# Patient Record
Sex: Male | Born: 1935 | Race: White | Hispanic: No | Marital: Married
Health system: Southern US, Community
[De-identification: ages and names within clinical notes are randomized; demographics above are authoritative.]

---

## 2005-02-15 ENCOUNTER — Ambulatory Visit: Payer: Self-pay | Admitting: Otolaryngology

## 2005-02-18 ENCOUNTER — Inpatient Hospital Stay: Payer: Self-pay | Admitting: Otolaryngology

## 2005-02-26 ENCOUNTER — Ambulatory Visit: Payer: Self-pay | Admitting: Otolaryngology

## 2005-03-15 ENCOUNTER — Ambulatory Visit: Payer: Self-pay | Admitting: Radiation Oncology

## 2005-04-05 ENCOUNTER — Ambulatory Visit: Payer: Self-pay | Admitting: Radiation Oncology

## 2005-05-06 ENCOUNTER — Ambulatory Visit: Payer: Self-pay | Admitting: Radiation Oncology

## 2005-06-03 ENCOUNTER — Ambulatory Visit: Payer: Self-pay | Admitting: Radiation Oncology

## 2005-11-04 ENCOUNTER — Ambulatory Visit: Payer: Self-pay | Admitting: Radiation Oncology

## 2006-05-09 ENCOUNTER — Ambulatory Visit: Payer: Self-pay | Admitting: Radiation Oncology

## 2007-05-07 ENCOUNTER — Ambulatory Visit: Payer: Self-pay | Admitting: Radiation Oncology

## 2007-05-10 ENCOUNTER — Ambulatory Visit: Payer: Self-pay | Admitting: Radiation Oncology

## 2007-05-29 ENCOUNTER — Ambulatory Visit: Payer: Self-pay | Admitting: Internal Medicine

## 2007-06-04 ENCOUNTER — Ambulatory Visit: Payer: Self-pay | Admitting: Radiation Oncology

## 2008-05-06 ENCOUNTER — Ambulatory Visit: Payer: Self-pay | Admitting: Radiation Oncology

## 2008-05-09 ENCOUNTER — Ambulatory Visit: Payer: Self-pay | Admitting: Radiation Oncology

## 2008-06-03 ENCOUNTER — Ambulatory Visit: Payer: Self-pay | Admitting: Radiation Oncology

## 2008-06-03 ENCOUNTER — Inpatient Hospital Stay: Payer: Self-pay | Admitting: Internal Medicine

## 2009-05-06 ENCOUNTER — Ambulatory Visit: Payer: Self-pay | Admitting: Radiation Oncology

## 2009-05-09 ENCOUNTER — Ambulatory Visit: Payer: Self-pay | Admitting: Radiation Oncology

## 2009-06-03 ENCOUNTER — Ambulatory Visit: Payer: Self-pay | Admitting: Radiation Oncology

## 2010-05-08 ENCOUNTER — Ambulatory Visit: Payer: Self-pay | Admitting: Radiation Oncology

## 2010-06-04 ENCOUNTER — Ambulatory Visit: Payer: Self-pay | Admitting: Radiation Oncology

## 2011-05-12 ENCOUNTER — Ambulatory Visit: Payer: Self-pay | Admitting: Physician Assistant

## 2014-03-05 ENCOUNTER — Ambulatory Visit: Payer: Self-pay | Admitting: Internal Medicine

## 2014-03-05 LAB — CBC
HCT: 37.1 % — ABNORMAL LOW (ref 40.0–52.0)
HGB: 12 g/dL — ABNORMAL LOW (ref 13.0–18.0)
MCH: 31.7 pg (ref 26.0–34.0)
MCHC: 32.3 g/dL (ref 32.0–36.0)
MCV: 98 fL (ref 80–100)
PLATELETS: 190 10*3/uL (ref 150–440)
RBC: 3.78 10*6/uL — ABNORMAL LOW (ref 4.40–5.90)
RDW: 13.4 % (ref 11.5–14.5)
WBC: 9.8 10*3/uL (ref 3.8–10.6)

## 2014-03-05 LAB — URINALYSIS, COMPLETE
BILIRUBIN, UR: NEGATIVE
Bacteria: NONE SEEN
Blood: NEGATIVE
Glucose,UR: NEGATIVE mg/dL (ref 0–75)
Ketone: NEGATIVE
LEUKOCYTE ESTERASE: NEGATIVE
Nitrite: NEGATIVE
Ph: 7 (ref 4.5–8.0)
Protein: NEGATIVE
RBC,UR: 2 /HPF (ref 0–5)
Specific Gravity: 1.01 (ref 1.003–1.030)
Squamous Epithelial: NONE SEEN

## 2014-03-05 LAB — COMPREHENSIVE METABOLIC PANEL
ALBUMIN: 3.3 g/dL — AB (ref 3.4–5.0)
Alkaline Phosphatase: 78 U/L
Anion Gap: 7 (ref 7–16)
BUN: 24 mg/dL — AB (ref 7–18)
Bilirubin,Total: 0.4 mg/dL (ref 0.2–1.0)
Calcium, Total: 8.8 mg/dL (ref 8.5–10.1)
Chloride: 104 mmol/L (ref 98–107)
Co2: 29 mmol/L (ref 21–32)
Creatinine: 1.19 mg/dL (ref 0.60–1.30)
EGFR (African American): >60
EGFR (Non-African Amer.): >60
GLUCOSE: 113 mg/dL — AB (ref 65–99)
Osmolality: 284 (ref 275–301)
POTASSIUM: 4 mmol/L (ref 3.5–5.1)
SGOT(AST): 28 U/L (ref 15–37)
SGPT (ALT): 21 U/L
SODIUM: 140 mmol/L (ref 136–145)
Total Protein: 6.6 g/dL (ref 6.4–8.2)

## 2014-03-05 LAB — PROTIME-INR
INR: 1
Prothrombin Time: 12.7 secs (ref 11.5–14.7)

## 2014-03-05 LAB — TROPONIN I: TROPONIN-I: 0.04 ng/mL

## 2014-03-06 LAB — HEMOGLOBIN A1C: Hemoglobin A1C: 6.2 % (ref 4.2–6.3)

## 2014-03-07 ENCOUNTER — Inpatient Hospital Stay: Payer: Self-pay | Admitting: Internal Medicine

## 2014-03-08 LAB — CBC WITH DIFFERENTIAL/PLATELET
Basophil #: 0.1 10*3/uL (ref 0.0–0.1)
Basophil %: 0.7 %
EOS ABS: 0.4 10*3/uL (ref 0.0–0.7)
Eosinophil %: 5.2 %
HCT: 32 % — ABNORMAL LOW (ref 40.0–52.0)
HGB: 10.3 g/dL — AB (ref 13.0–18.0)
Lymphocyte #: 1.6 10*3/uL (ref 1.0–3.6)
Lymphocyte %: 21.8 %
MCH: 31.9 pg (ref 26.0–34.0)
MCHC: 32.1 g/dL (ref 32.0–36.0)
MCV: 100 fL (ref 80–100)
Monocyte #: 1 x10 3/mm (ref 0.2–1.0)
Monocyte %: 12.9 %
NEUTROS ABS: 4.5 10*3/uL (ref 1.4–6.5)
Neutrophil %: 59.4 %
PLATELETS: 161 10*3/uL (ref 150–440)
RBC: 3.22 10*6/uL — ABNORMAL LOW (ref 4.40–5.90)
RDW: 13.2 % (ref 11.5–14.5)
WBC: 7.6 10*3/uL (ref 3.8–10.6)

## 2014-03-08 LAB — BASIC METABOLIC PANEL
ANION GAP: 8 (ref 7–16)
BUN: 24 mg/dL — ABNORMAL HIGH (ref 7–18)
CALCIUM: 8 mg/dL — AB (ref 8.5–10.1)
CHLORIDE: 108 mmol/L — AB (ref 98–107)
Co2: 27 mmol/L (ref 21–32)
Creatinine: 1.16 mg/dL (ref 0.60–1.30)
EGFR (African American): >60
GLUCOSE: 93 mg/dL (ref 65–99)
Osmolality: 289 (ref 275–301)
Potassium: 4 mmol/L (ref 3.5–5.1)
SODIUM: 143 mmol/L (ref 136–145)

## 2014-03-09 LAB — HEMOGLOBIN: HGB: 10.2 g/dL — AB (ref 13.0–18.0)

## 2014-03-11 LAB — CBC WITH DIFFERENTIAL/PLATELET
Basophil #: 0 10*3/uL (ref 0.0–0.1)
Basophil %: 0.4 %
EOS PCT: 1.4 %
Eosinophil #: 0.1 10*3/uL (ref 0.0–0.7)
HCT: 33.6 % — AB (ref 40.0–52.0)
HGB: 11.1 g/dL — ABNORMAL LOW (ref 13.0–18.0)
LYMPHS PCT: 13.8 %
Lymphocyte #: 1.3 10*3/uL (ref 1.0–3.6)
MCH: 32.1 pg (ref 26.0–34.0)
MCHC: 33 g/dL (ref 32.0–36.0)
MCV: 97 fL (ref 80–100)
Monocyte #: 1.4 x10 3/mm — ABNORMAL HIGH (ref 0.2–1.0)
Monocyte %: 14.9 %
Neutrophil #: 6.7 10*3/uL — ABNORMAL HIGH (ref 1.4–6.5)
Neutrophil %: 69.5 %
PLATELETS: 185 10*3/uL (ref 150–440)
RBC: 3.46 10*6/uL — AB (ref 4.40–5.90)
RDW: 13.1 % (ref 11.5–14.5)
WBC: 9.6 10*3/uL (ref 3.8–10.6)

## 2014-03-12 LAB — CBC WITH DIFFERENTIAL/PLATELET
BASOS ABS: 0 10*3/uL (ref 0.0–0.1)
BASOS PCT: 0.4 %
EOS ABS: 0.2 10*3/uL (ref 0.0–0.7)
Eosinophil %: 2 %
HCT: 34 % — AB (ref 40.0–52.0)
HGB: 11.2 g/dL — AB (ref 13.0–18.0)
Lymphocyte #: 1.4 10*3/uL (ref 1.0–3.6)
Lymphocyte %: 14.3 %
MCH: 32.1 pg (ref 26.0–34.0)
MCHC: 33 g/dL (ref 32.0–36.0)
MCV: 97 fL (ref 80–100)
MONO ABS: 1.6 x10 3/mm — AB (ref 0.2–1.0)
MONOS PCT: 16.4 %
NEUTROS PCT: 66.9 %
Neutrophil #: 6.7 10*3/uL — ABNORMAL HIGH (ref 1.4–6.5)
PLATELETS: 186 10*3/uL (ref 150–440)
RBC: 3.5 10*6/uL — ABNORMAL LOW (ref 4.40–5.90)
RDW: 13.5 % (ref 11.5–14.5)
WBC: 10 10*3/uL (ref 3.8–10.6)

## 2014-03-12 LAB — CREATININE, SERUM
Creatinine: 1.14 mg/dL (ref 0.60–1.30)
EGFR (African American): >60
EGFR (Non-African Amer.): >60

## 2014-03-26 ENCOUNTER — Inpatient Hospital Stay: Payer: Self-pay | Admitting: Internal Medicine

## 2014-03-26 LAB — CBC WITH DIFFERENTIAL/PLATELET
Basophil #: 0.1 10*3/uL (ref 0.0–0.1)
Basophil %: 0.4 %
Eosinophil #: 0.2 10*3/uL (ref 0.0–0.7)
Eosinophil %: 1.5 %
HCT: 37.9 % — AB (ref 40.0–52.0)
HGB: 12 g/dL — ABNORMAL LOW (ref 13.0–18.0)
LYMPHS PCT: 7.1 %
Lymphocyte #: 1 10*3/uL (ref 1.0–3.6)
MCH: 30.4 pg (ref 26.0–34.0)
MCHC: 31.6 g/dL — ABNORMAL LOW (ref 32.0–36.0)
MCV: 96 fL (ref 80–100)
MONO ABS: 2 x10 3/mm — AB (ref 0.2–1.0)
Monocyte %: 14.5 %
NEUTROS PCT: 76.5 %
Neutrophil #: 10.8 10*3/uL — ABNORMAL HIGH (ref 1.4–6.5)
Platelet: 340 10*3/uL (ref 150–440)
RBC: 3.94 10*6/uL — ABNORMAL LOW (ref 4.40–5.90)
RDW: 13.8 % (ref 11.5–14.5)
WBC: 14.1 10*3/uL — AB (ref 3.8–10.6)

## 2014-03-26 LAB — URINALYSIS, COMPLETE
BACTERIA: NONE SEEN
Bilirubin,UR: NEGATIVE
Blood: NEGATIVE
GLUCOSE, UR: NEGATIVE mg/dL (ref 0–75)
KETONE: NEGATIVE
Leukocyte Esterase: NEGATIVE
NITRITE: NEGATIVE
Ph: 5 (ref 4.5–8.0)
Protein: NEGATIVE
RBC,UR: 1 /HPF (ref 0–5)
Specific Gravity: 1.01 (ref 1.003–1.030)
Squamous Epithelial: 1

## 2014-03-26 LAB — COMPREHENSIVE METABOLIC PANEL
ALT: 20 U/L
Albumin: 2.1 g/dL — ABNORMAL LOW (ref 3.4–5.0)
Alkaline Phosphatase: 178 U/L — ABNORMAL HIGH
Anion Gap: 7 (ref 7–16)
BUN: 22 mg/dL — ABNORMAL HIGH (ref 7–18)
Bilirubin,Total: 0.3 mg/dL (ref 0.2–1.0)
CHLORIDE: 102 mmol/L (ref 98–107)
CO2: 35 mmol/L — AB (ref 21–32)
Calcium, Total: 8.6 mg/dL (ref 8.5–10.1)
Creatinine: 1.16 mg/dL (ref 0.60–1.30)
EGFR (African American): >60
Glucose: 79 mg/dL (ref 65–99)
Osmolality: 289 (ref 275–301)
Potassium: 4.3 mmol/L (ref 3.5–5.1)
SGOT(AST): 30 U/L (ref 15–37)
SODIUM: 144 mmol/L (ref 136–145)
Total Protein: 6.7 g/dL (ref 6.4–8.2)

## 2014-03-26 LAB — LIPASE, BLOOD: LIPASE: 22 U/L — AB (ref 73–393)

## 2014-03-26 LAB — MAGNESIUM: Magnesium: 2 mg/dL

## 2014-03-26 LAB — TROPONIN I: TROPONIN-I: 0.08 ng/mL — AB

## 2014-03-27 LAB — CBC WITH DIFFERENTIAL/PLATELET
BASOS PCT: 0.9 %
Basophil #: 0.1 10*3/uL (ref 0.0–0.1)
EOS ABS: 0.3 10*3/uL (ref 0.0–0.7)
Eosinophil %: 2.6 %
HCT: 33.4 % — ABNORMAL LOW (ref 40.0–52.0)
HGB: 10.6 g/dL — ABNORMAL LOW (ref 13.0–18.0)
LYMPHS ABS: 0.8 10*3/uL — AB (ref 1.0–3.6)
Lymphocyte %: 6.2 %
MCH: 30.7 pg (ref 26.0–34.0)
MCHC: 31.8 g/dL — AB (ref 32.0–36.0)
MCV: 96 fL (ref 80–100)
Monocyte #: 1.7 x10 3/mm — ABNORMAL HIGH (ref 0.2–1.0)
Monocyte %: 13.1 %
NEUTROS PCT: 77.2 %
Neutrophil #: 9.9 10*3/uL — ABNORMAL HIGH (ref 1.4–6.5)
PLATELETS: 290 10*3/uL (ref 150–440)
RBC: 3.46 10*6/uL — ABNORMAL LOW (ref 4.40–5.90)
RDW: 13.8 % (ref 11.5–14.5)
WBC: 12.9 10*3/uL — AB (ref 3.8–10.6)

## 2014-03-27 LAB — BASIC METABOLIC PANEL
Anion Gap: 6 — ABNORMAL LOW (ref 7–16)
BUN: 23 mg/dL — AB (ref 7–18)
CREATININE: 1.12 mg/dL (ref 0.60–1.30)
Calcium, Total: 8.2 mg/dL — ABNORMAL LOW (ref 8.5–10.1)
Chloride: 101 mmol/L (ref 98–107)
Co2: 35 mmol/L — ABNORMAL HIGH (ref 21–32)
Glucose: 103 mg/dL — ABNORMAL HIGH (ref 65–99)
Osmolality: 287 (ref 275–301)
Potassium: 3.9 mmol/L (ref 3.5–5.1)
SODIUM: 142 mmol/L (ref 136–145)

## 2014-03-27 LAB — TROPONIN I
TROPONIN-I: 0.1 ng/mL — AB
Troponin-I: 0.08 ng/mL — ABNORMAL HIGH

## 2014-03-28 LAB — CREATININE, SERUM
CREATININE: 1.28 mg/dL (ref 0.60–1.30)
EGFR (African American): >60
GFR CALC NON AF AMER: 56 — AB

## 2014-03-29 LAB — CBC WITH DIFFERENTIAL/PLATELET
BASOS PCT: 0.1 %
Basophil #: 0 10*3/uL (ref 0.0–0.1)
EOS ABS: 0 10*3/uL (ref 0.0–0.7)
EOS PCT: 0 %
HCT: 33 % — AB (ref 40.0–52.0)
HGB: 10.7 g/dL — AB (ref 13.0–18.0)
LYMPHS ABS: 0.6 10*3/uL — AB (ref 1.0–3.6)
LYMPHS PCT: 4 %
MCH: 30.5 pg (ref 26.0–34.0)
MCHC: 32.3 g/dL (ref 32.0–36.0)
MCV: 95 fL (ref 80–100)
Monocyte #: 1.4 x10 3/mm — ABNORMAL HIGH (ref 0.2–1.0)
Monocyte %: 9.8 %
NEUTROS PCT: 86.1 %
Neutrophil #: 12.2 10*3/uL — ABNORMAL HIGH (ref 1.4–6.5)
PLATELETS: 315 10*3/uL (ref 150–440)
RBC: 3.49 10*6/uL — AB (ref 4.40–5.90)
RDW: 13.8 % (ref 11.5–14.5)
WBC: 14.1 10*3/uL — AB (ref 3.8–10.6)

## 2014-03-29 LAB — BASIC METABOLIC PANEL
ANION GAP: 6 — AB (ref 7–16)
BUN: 29 mg/dL — ABNORMAL HIGH (ref 7–18)
CALCIUM: 8.5 mg/dL (ref 8.5–10.1)
CREATININE: 1.33 mg/dL — AB (ref 0.60–1.30)
Chloride: 101 mmol/L (ref 98–107)
Co2: 37 mmol/L — ABNORMAL HIGH (ref 21–32)
GFR CALC NON AF AMER: 53 — AB
GLUCOSE: 183 mg/dL — AB (ref 65–99)
Osmolality: 297 (ref 275–301)
Potassium: 3.9 mmol/L (ref 3.5–5.1)
Sodium: 144 mmol/L (ref 136–145)

## 2014-03-30 LAB — CBC WITH DIFFERENTIAL/PLATELET
BASOS PCT: 0.1 %
Basophil #: 0 10*3/uL (ref 0.0–0.1)
Eosinophil #: 0 10*3/uL (ref 0.0–0.7)
Eosinophil %: 0 %
HCT: 35.4 % — AB (ref 40.0–52.0)
HGB: 11.1 g/dL — AB (ref 13.0–18.0)
LYMPHS ABS: 0.7 10*3/uL — AB (ref 1.0–3.6)
Lymphocyte %: 4.7 %
MCH: 30.2 pg (ref 26.0–34.0)
MCHC: 31.4 g/dL — AB (ref 32.0–36.0)
MCV: 96 fL (ref 80–100)
MONO ABS: 1.5 x10 3/mm — AB (ref 0.2–1.0)
Monocyte %: 10.4 %
NEUTROS ABS: 12.5 10*3/uL — AB (ref 1.4–6.5)
Neutrophil %: 84.8 %
Platelet: 319 10*3/uL (ref 150–440)
RBC: 3.68 10*6/uL — AB (ref 4.40–5.90)
RDW: 13.9 % (ref 11.5–14.5)
WBC: 14.8 10*3/uL — ABNORMAL HIGH (ref 3.8–10.6)

## 2014-03-30 LAB — BASIC METABOLIC PANEL
ANION GAP: 7 (ref 7–16)
BUN: 30 mg/dL — AB (ref 7–18)
CALCIUM: 8.2 mg/dL — AB (ref 8.5–10.1)
CO2: 35 mmol/L — AB (ref 21–32)
Chloride: 103 mmol/L (ref 98–107)
Creatinine: 1.29 mg/dL (ref 0.60–1.30)
EGFR (African American): >60
EGFR (Non-African Amer.): 55 — ABNORMAL LOW
Glucose: 138 mg/dL — ABNORMAL HIGH (ref 65–99)
Osmolality: 297 (ref 275–301)
Potassium: 4.2 mmol/L (ref 3.5–5.1)
Sodium: 145 mmol/L (ref 136–145)

## 2014-03-30 LAB — VANCOMYCIN, TROUGH: VANCOMYCIN, TROUGH: 15 ug/mL (ref 10–20)

## 2014-03-31 LAB — CULTURE, BLOOD (SINGLE)

## 2014-04-01 LAB — BASIC METABOLIC PANEL
ANION GAP: 4 — AB (ref 7–16)
BUN: 29 mg/dL — AB (ref 7–18)
Calcium, Total: 8.4 mg/dL — ABNORMAL LOW (ref 8.5–10.1)
Chloride: 96 mmol/L — ABNORMAL LOW (ref 98–107)
Co2: 42 mmol/L (ref 21–32)
Creatinine: 1.3 mg/dL (ref 0.60–1.30)
EGFR (African American): >60
GFR CALC NON AF AMER: 55 — AB
Glucose: 107 mg/dL — ABNORMAL HIGH (ref 65–99)
OSMOLALITY: 289 (ref 275–301)
POTASSIUM: 3.5 mmol/L (ref 3.5–5.1)
Sodium: 142 mmol/L (ref 136–145)

## 2014-04-04 LAB — CBC WITH DIFFERENTIAL/PLATELET
Basophil #: 0.1 10*3/uL (ref 0.0–0.1)
Basophil %: 0.3 %
EOS ABS: 0 10*3/uL (ref 0.0–0.7)
Eosinophil %: 0 %
HCT: 31.8 % — ABNORMAL LOW (ref 40.0–52.0)
HGB: 10.2 g/dL — AB (ref 13.0–18.0)
LYMPHS ABS: 0.4 10*3/uL — AB (ref 1.0–3.6)
LYMPHS PCT: 1.6 %
MCH: 30.1 pg (ref 26.0–34.0)
MCHC: 32.2 g/dL (ref 32.0–36.0)
MCV: 94 fL (ref 80–100)
MONO ABS: 1.8 x10 3/mm — AB (ref 0.2–1.0)
Monocyte %: 7.3 %
NEUTROS PCT: 90.8 %
Neutrophil #: 22.5 10*3/uL — ABNORMAL HIGH (ref 1.4–6.5)
Platelet: 181 10*3/uL (ref 150–440)
RBC: 3.4 10*6/uL — ABNORMAL LOW (ref 4.40–5.90)
RDW: 14.3 % (ref 11.5–14.5)
WBC: 24.7 10*3/uL — AB (ref 3.8–10.6)

## 2014-04-04 LAB — BASIC METABOLIC PANEL
Anion Gap: 5 — ABNORMAL LOW (ref 7–16)
BUN: 48 mg/dL — AB (ref 7–18)
CALCIUM: 7.9 mg/dL — AB (ref 8.5–10.1)
CO2: 44 mmol/L — AB (ref 21–32)
Chloride: 93 mmol/L — ABNORMAL LOW (ref 98–107)
Creatinine: 1.88 mg/dL — ABNORMAL HIGH (ref 0.60–1.30)
EGFR (African American): 43 — ABNORMAL LOW
EGFR (Non-African Amer.): 36 — ABNORMAL LOW
GLUCOSE: 169 mg/dL — AB (ref 65–99)
OSMOLALITY: 300 (ref 275–301)
POTASSIUM: 2.7 mmol/L — AB (ref 3.5–5.1)
Sodium: 142 mmol/L (ref 136–145)

## 2014-04-05 ENCOUNTER — Ambulatory Visit (HOSPITAL_COMMUNITY)
Admission: AD | Admit: 2014-04-05 | Discharge: 2014-04-05 | Disposition: A | Discharge status: Home / Self Care | Payer: Medicare Other | Source: Other Acute Inpatient Hospital | Attending: Internal Medicine | Admitting: Internal Medicine

## 2014-04-05 ENCOUNTER — Ambulatory Visit: Payer: Self-pay | Admitting: Internal Medicine

## 2014-04-05 ENCOUNTER — Inpatient Hospital Stay
Admission: EM | Admit: 2014-04-05 | Discharge: 2014-05-06 | Disposition: E | Payer: Medicare Other | Attending: Internal Medicine | Admitting: Internal Medicine

## 2014-04-05 DIAGNOSIS — J189 Pneumonia, unspecified organism: Secondary | ICD-10-CM

## 2014-04-05 DIAGNOSIS — J9690 Respiratory failure, unspecified, unspecified whether with hypoxia or hypercapnia: Secondary | ICD-10-CM | POA: Diagnosis present

## 2014-04-05 DIAGNOSIS — Z95828 Presence of other vascular implants and grafts: Secondary | ICD-10-CM

## 2014-04-05 LAB — BASIC METABOLIC PANEL
Anion Gap: 4 — ABNORMAL LOW (ref 7–16)
BUN: 51 mg/dL — ABNORMAL HIGH (ref 7–18)
CHLORIDE: 100 mmol/L (ref 98–107)
CO2: 42 mmol/L — AB (ref 21–32)
CREATININE: 1.74 mg/dL — AB (ref 0.60–1.30)
Calcium, Total: 7.6 mg/dL — ABNORMAL LOW (ref 8.5–10.1)
EGFR (African American): 47 — ABNORMAL LOW
EGFR (Non-African Amer.): 39 — ABNORMAL LOW
Glucose: 193 mg/dL — ABNORMAL HIGH (ref 65–99)
Osmolality: 309 (ref 275–301)
POTASSIUM: 2.9 mmol/L — AB (ref 3.5–5.1)
SODIUM: 146 mmol/L — AB (ref 136–145)

## 2014-04-05 LAB — MAGNESIUM: Magnesium: 1.9 mg/dL

## 2014-04-06 ENCOUNTER — Other Ambulatory Visit (HOSPITAL_COMMUNITY): Payer: Medicare Other

## 2014-04-06 ENCOUNTER — Other Ambulatory Visit: Payer: Self-pay

## 2014-04-06 LAB — LIPID PANEL
CHOL/HDL RATIO: 3.3 ratio
Cholesterol: 177 mg/dL (ref 0–200)
HDL: 54 mg/dL (ref >39)
LDL Cholesterol: 103 mg/dL — ABNORMAL HIGH (ref 0–99)
Triglycerides: 100 mg/dL (ref <150)
VLDL: 20 mg/dL (ref 0–40)

## 2014-04-06 LAB — CBC WITH DIFFERENTIAL/PLATELET
BAND NEUTROPHILS: 38 % — AB (ref 0–10)
BASOS PCT: 0 % (ref 0–1)
Basophils Absolute: 0 10*3/uL (ref 0.0–0.1)
Blasts: 0 %
EOS ABS: 0 10*3/uL (ref 0.0–0.7)
Eosinophils Relative: 0 % (ref 0–5)
HCT: 26.1 % — ABNORMAL LOW (ref 39.0–52.0)
HEMOGLOBIN: 8.2 g/dL — AB (ref 13.0–17.0)
LYMPHS ABS: 0.7 10*3/uL (ref 0.7–4.0)
Lymphocytes Relative: 2 % — ABNORMAL LOW (ref 12–46)
MCH: 30.5 pg (ref 26.0–34.0)
MCHC: 31.4 g/dL (ref 30.0–36.0)
MCV: 97 fL (ref 78.0–100.0)
MYELOCYTES: 0 %
Metamyelocytes Relative: 0 %
Monocytes Absolute: 1.7 10*3/uL — ABNORMAL HIGH (ref 0.1–1.0)
Monocytes Relative: 5 % (ref 3–12)
NEUTROS ABS: 32.4 10*3/uL — AB (ref 1.7–7.7)
NEUTROS PCT: 55 % (ref 43–77)
NRBC: 0 /100{WBCs}
PROMYELOCYTES ABS: 0 %
Platelets: 155 10*3/uL (ref 150–400)
RBC: 2.69 MIL/uL — ABNORMAL LOW (ref 4.22–5.81)
RDW: 14.9 % (ref 11.5–15.5)
WBC MORPHOLOGY: INCREASED
WBC: 34.8 10*3/uL — ABNORMAL HIGH (ref 4.0–10.5)

## 2014-04-06 LAB — BLOOD GAS, ARTERIAL
ACID-BASE EXCESS: 15.9 mmol/L — AB (ref 0.0–2.0)
Bicarbonate: 40.7 mEq/L — ABNORMAL HIGH (ref 20.0–24.0)
Delivery systems: POSITIVE
EXPIRATORY PAP: 5
FIO2: 0.8 %
Inspiratory PAP: 15
O2 Saturation: 95.1 %
PH ART: 7.483 — AB (ref 7.350–7.450)
Patient temperature: 97.9
TCO2: 42.4 mmol/L (ref 0–100)
pCO2 arterial: 54.7 mmHg — ABNORMAL HIGH (ref 35.0–45.0)
pO2, Arterial: 81.8 mmHg (ref 80.0–100.0)

## 2014-04-06 LAB — COMPREHENSIVE METABOLIC PANEL
ALT: 23 U/L (ref 0–53)
ANION GAP: 14 (ref 5–15)
AST: 38 U/L — AB (ref 0–37)
Albumin: 2 g/dL — ABNORMAL LOW (ref 3.5–5.2)
Alkaline Phosphatase: 64 U/L (ref 39–117)
BILIRUBIN TOTAL: 0.7 mg/dL (ref 0.3–1.2)
BUN: 53 mg/dL — AB (ref 6–23)
CALCIUM: 7.9 mg/dL — AB (ref 8.4–10.5)
CO2: 39 mmol/L — AB (ref 19–32)
Chloride: 101 mEq/L (ref 96–112)
Creatinine, Ser: 1.57 mg/dL — ABNORMAL HIGH (ref 0.50–1.35)
GFR calc Af Amer: 41 mL/min — ABNORMAL LOW (ref >90)
GFR calc non Af Amer: 36 mL/min — ABNORMAL LOW (ref >90)
GLUCOSE: 108 mg/dL — AB (ref 70–99)
Potassium: 2.9 mmol/L — ABNORMAL LOW (ref 3.5–5.1)
Sodium: 154 mmol/L — ABNORMAL HIGH (ref 135–145)
Total Protein: 5.3 g/dL — ABNORMAL LOW (ref 6.0–8.3)

## 2014-04-06 LAB — TSH: TSH: 2.134 u[IU]/mL (ref 0.350–4.500)

## 2014-04-06 LAB — URINALYSIS, ROUTINE W REFLEX MICROSCOPIC
BILIRUBIN URINE: NEGATIVE
Glucose, UA: NEGATIVE mg/dL
Ketones, ur: 15 mg/dL — AB
Leukocytes, UA: NEGATIVE
Nitrite: NEGATIVE
Protein, ur: NEGATIVE mg/dL
Specific Gravity, Urine: 1.025 (ref 1.005–1.030)
UROBILINOGEN UA: 0.2 mg/dL (ref 0.0–1.0)
pH: 5 (ref 5.0–8.0)

## 2014-04-06 LAB — CK TOTAL AND CKMB (NOT AT ARMC)
CK, MB: 5.9 ng/mL — ABNORMAL HIGH (ref 0.3–4.0)
Relative Index: 2.6 — ABNORMAL HIGH (ref 0.0–2.5)
Total CK: 226 U/L (ref 7–232)

## 2014-04-06 LAB — URINE MICROSCOPIC-ADD ON

## 2014-04-06 LAB — TROPONIN I: TROPONIN I: 0.35 ng/mL — AB (ref <0.031)

## 2014-04-06 LAB — PROTIME-INR
INR: 1.27 (ref 0.00–1.49)
PROTHROMBIN TIME: 16.1 s — AB (ref 11.6–15.2)

## 2014-04-06 LAB — PHOSPHORUS: PHOSPHORUS: 2.5 mg/dL (ref 2.3–4.6)

## 2014-04-06 LAB — MAGNESIUM: Magnesium: 1.9 mg/dL (ref 1.5–2.5)

## 2014-04-06 LAB — PROCALCITONIN: PROCALCITONIN: 0.51 ng/mL

## 2014-04-06 LAB — SEDIMENTATION RATE: Sed Rate: 65 mm/hr — ABNORMAL HIGH (ref 0–16)

## 2014-04-07 LAB — CBC WITH DIFFERENTIAL/PLATELET
Basophils Absolute: 0 10*3/uL (ref 0.0–0.1)
Basophils Relative: 0 % (ref 0–1)
EOS ABS: 0 10*3/uL (ref 0.0–0.7)
EOS PCT: 0 % (ref 0–5)
HEMATOCRIT: 20.8 % — AB (ref 39.0–52.0)
Hemoglobin: 6.5 g/dL — CL (ref 13.0–17.0)
LYMPHS ABS: 0.3 10*3/uL — AB (ref 0.7–4.0)
Lymphocytes Relative: 1 % — ABNORMAL LOW (ref 12–46)
MCH: 29.5 pg (ref 26.0–34.0)
MCHC: 31.3 g/dL (ref 30.0–36.0)
MCV: 94.5 fL (ref 78.0–100.0)
Monocytes Absolute: 1.9 10*3/uL — ABNORMAL HIGH (ref 0.1–1.0)
Monocytes Relative: 6 % (ref 3–12)
NEUTROS ABS: 29.6 10*3/uL — AB (ref 1.7–7.7)
Neutrophils Relative %: 93 % — ABNORMAL HIGH (ref 43–77)
Platelets: 110 10*3/uL — ABNORMAL LOW (ref 150–400)
RBC: 2.2 MIL/uL — ABNORMAL LOW (ref 4.22–5.81)
RDW: 15.3 % (ref 11.5–15.5)
WBC MORPHOLOGY: INCREASED
WBC: 31.8 10*3/uL — ABNORMAL HIGH (ref 4.0–10.5)

## 2014-04-07 LAB — VITAMIN B12: VITAMIN B 12: 384 pg/mL (ref 211–911)

## 2014-04-07 LAB — BASIC METABOLIC PANEL
Anion gap: 4 — ABNORMAL LOW (ref 5–15)
BUN: 53 mg/dL — AB (ref 6–23)
CALCIUM: 7.2 mg/dL — AB (ref 8.4–10.5)
CHLORIDE: 100 meq/L (ref 96–112)
CO2: 37 mmol/L — ABNORMAL HIGH (ref 19–32)
Creatinine, Ser: 1.53 mg/dL — ABNORMAL HIGH (ref 0.50–1.35)
GFR calc non Af Amer: 37 mL/min — ABNORMAL LOW (ref >90)
GFR, EST AFRICAN AMERICAN: 43 mL/min — AB (ref >90)
Glucose, Bld: 450 mg/dL — ABNORMAL HIGH (ref 70–99)
POTASSIUM: 4.3 mmol/L (ref 3.5–5.1)
Sodium: 141 mmol/L (ref 135–145)

## 2014-04-07 LAB — URINE CULTURE
COLONY COUNT: NO GROWTH
Culture: NO GROWTH

## 2014-04-07 LAB — ABO/RH: ABO/RH(D): A POS

## 2014-04-07 LAB — CBC
HCT: 19.5 % — ABNORMAL LOW (ref 39.0–52.0)
Hemoglobin: 6.2 g/dL — CL (ref 13.0–17.0)
MCH: 30.2 pg (ref 26.0–34.0)
MCHC: 31.8 g/dL (ref 30.0–36.0)
MCV: 95.1 fL (ref 78.0–100.0)
Platelets: 104 10*3/uL — ABNORMAL LOW (ref 150–400)
RBC: 2.05 MIL/uL — AB (ref 4.22–5.81)
RDW: 15.4 % (ref 11.5–15.5)
WBC: 29.2 10*3/uL — ABNORMAL HIGH (ref 4.0–10.5)

## 2014-04-07 LAB — C-REACTIVE PROTEIN: CRP: 14.6 mg/dL — ABNORMAL HIGH (ref <0.60)

## 2014-04-07 LAB — HEMOGLOBIN A1C
HEMOGLOBIN A1C: 6.6 % — AB (ref <5.7)
MEAN PLASMA GLUCOSE: 143 mg/dL — AB (ref <117)

## 2014-04-07 LAB — MAGNESIUM: Magnesium: 1.8 mg/dL (ref 1.5–2.5)

## 2014-04-07 LAB — T4, FREE: Free T4: 0.56 ng/dL — ABNORMAL LOW (ref 0.80–1.80)

## 2014-04-07 LAB — FERRITIN: Ferritin: 448 ng/mL — ABNORMAL HIGH (ref 22–322)

## 2014-04-07 LAB — PREPARE RBC (CROSSMATCH)

## 2014-04-07 LAB — PHOSPHORUS: Phosphorus: 3.4 mg/dL (ref 2.3–4.6)

## 2014-04-08 LAB — TYPE AND SCREEN
ABO/RH(D): A POS
ANTIBODY SCREEN: NEGATIVE
UNIT DIVISION: 0
Unit division: 0

## 2014-04-08 LAB — FOLATE RBC: RBC Folate: 2468 ng/mL — ABNORMAL HIGH (ref >280)

## 2014-04-12 LAB — CULTURE, BLOOD (ROUTINE X 2)
Culture: NO GROWTH
Culture: NO GROWTH

## 2014-05-06 DEATH — deceased

## 2014-07-27 NOTE — Discharge Summary (Signed)
PATIENT NAME:  Janalyn RouseDABBS, Wiley E MR#:  [id number] DATE OF BIRTH:  03/31/1917  DATE OF ADMISSION:  03/07/2014 DATE OF DISCHARGE:  03/13/2014  CHIEF COMPLAINT AT THE TIME OF ADMISSION: Left hip pain, status post fall.   ADMITTING DIAGNOSES:  1.  Left hip pain, acute and chronic, status post fall which was mechanical.  2.  Hypertension.  3.  Chronic skin ulcer on the left lower extremity.  4.  Hard of hearing.   DISCHARGE DIAGNOSES:  1.  Multilobar pneumonia.  2.  Left hip contusion after a fall; no fracture.  3.  Peripheral vascular disease.  4.  Anemia, which is chronic, probably normocytic.  5.  Hyperglycemia; diabetes mellitus is ruled out.  6.  Hypertension.   CONSULTATIONS: Vascular surgery, Dr. Festus BarrenJason Dew.   PROCEDURES: Arteriogram was done on 03/11/2014.   HISTORY OF PRESENT ILLNESS: The patient is a 81 year old Caucasian male brought into the ED after he sustained a fall. The patient was complaining of left hip pain at that time. The patient was admitted to the hospital with a left hip contusion. Please review Dr. Jae Direeddy's H and P for details.   HOSPITAL COURSE:  1.  Bilateral multilobar pneumonia. During the hospital course, the patient was coughing. Chest x-ray was done, which has revealed a pneumonia versus interstitial disease. CAT scan of the chest was done, which confirmed multilobar pneumonia. The patient was started on IV antibiotics, IV Zosyn and levofloxacin, as this is hospital-acquired pneumonia. The patient clinically improved significantly. The plan is to discharge him to a skilled nursing facility with levofloxacin to be continued.  2.  Left hip contusion. The patient had sustained a fall. No fractures were identified. Physical therapy was consulted they have recommended to place him in a rehabilitation center, probably a skilled nursing facility.  3.  The patient has bilateral lower extremity ulcers, which were not healing, with feeble pulses. 4.  Vascular surgery was  consulted regarding that, and the patient was evaluated by Dr. Wyn Quakerew. The patient had an arteriogram done on 03/11/2014 with a diagnosis of peripheral vascular disease. Dr. Wyn Quakerew, has recommended the patient to follow up with him in 3 to 4 weeks for ABI.  4.  The patient was hyperglycemic. Diabetes mellitus was ruled out, as hemoglobin A1c was at 6.2.  5.  The patient was evaluated for orthostatic hypotension, given IV fluids and subsequently he started feeling better. Denies any dizziness.   The patient is clinically doing better and [facility]. Today the patient's pulse oximetry was at 87% on room air; he is at 92% on 2 L, so the plan is to continue 2 L of oxygen via nasal cannula as the patient has hypoxic respiratory failure from multilobar pneumonia.    PHYSICAL EXAMINATION:  VITAL SIGNS: Temperature 98.1, pulse 77, respirations 18, blood pressure 139/70, pulse oximetry is 98% on 2 L.  GENERAL APPEARANCE: Well-developed.  Not in any acute distress. Thin. Hard of hearing.  HEENT: Normocephalic, atraumatic. Pupils are equally reacting to light and accommodation. Moist mucous membranes.  NECK: Supple. No JVD. No thyromegaly. Range of motion is intact.  LUNGS: Positive crackles, bilaterally. No wheezing. No accessory muscle use. There is no anterior chest wall tenderness on palpation.  CARDIAC: Regular rate and rhythm. Positive murmur, which is ejection systolic murmur. No thrills.  GASTROINTESTINAL: Soft. Bowel sounds are positive in all 4 quadrants. Nontender, nondistended. No abdominal masses felt.  NEUROLOGIC: Awake, alert,  and oriented x 3.  EXTREMITIES: No cyanosis or clubbing. Negative edema. Dorsalis pedis and posterior tibialis pulses are very feeble.  PSYCHIATRIC: The patient is awake and alert, and oriented x 3.   MEDICATIONS: Lisinopril 20 mg p.o. once daily, collagenase  topical application once daily, aspirin 81 mg  p.o. once daily, Colace 100 mg p.o. 2 times a day as needed for constipation, senna 1 tablet p.o. 2 times a day as needed for constipation, acetaminophen with hydrocodone 325/5 mg 1 tablet p.o. every 6 hours as needed for moderate to severe pain, Tylenol 650 mg p.o. every 4 hours as needed for mild pain, levofloxacin 750 mg 1 tablet p.o. every 48 hours given in 6 doses, Florastor 1 tablet p.o. once daily for 14 days then stop. Lisinopril 10 mg, which was his home medication, was discontinued. Continue 2 L of oxygen.   DIET: Low fat, low sodium.   ACTIVITY: As tolerated, as recommended by physical therapy.   FOLLOW-UP APPOINTMENTS: Follow up with primary care physician in 1-2 days, and Dr. Wyn Quaker in 2-4 weeks.   The plan of care was discussed in detail with the patient and his family members; they all verbalized understanding of the plan.   TOTAL TIME SPENT ON THE DISCHARGE: 45 minutes.    ____________________________ Ramonita Lab, MD ag:MT D: 03/13/2014 15:54:13 ET T: 03/13/2014 16:43:02 ET JOB#: [id number]  cc: Ramonita Lab, MD, <Dictator> Annice Needy, MD Kandyce Rud, MD  Ramonita Lab MD ELECTRONICALLY SIGNED 03/21/2014 17:03

## 2014-07-27 NOTE — Consult Note (Signed)
CHIEF COMPLAINT and HISTORY:  Subjective/Chief Complaint hip pain, LE ulcers   History of Present Illness Patient is admitted yesterday for hip pain.  He lives alone and has fallen frequently lately.  He has ulcers on both lower extremities of unkown duration.  he thinks these have been there 3-4 weeks.  He had no trauma to these areas.  They are not really painful.  He has not really noticed them much.  He has an ulcer onthe postrior right calf that is about 4x4.  The more worrisome ulcer is on the left ankle/lower calf area.  This is about 6x5 and has some keratotic skin around this.  it is kind of pale and appears unhelathy.  There is not much purulent drainage.  He also describes tiredness in his legs with walking for months to years.  This has been progressive over time.   PAST MEDICAL/SURGICAL HISTORY:  Past Medical History:   Skin Cancer:    Hypertension:    High Cholesterol:   ALLERGIES:  Allergies:  No Known Allergies:   HOME MEDICATIONS:  Home Medications: Medication Instructions Status  Aspirin 81 mg.  1 tab(s)  once a day  Active  lisinopril 10 mg oral tablet 1 tab(s)  once a day  Active   Family and Social History:  Family History Non-Contributory   Social History negative tobacco, negative ETOH, negative Illicit drugs   Place of Living Home   Review of Systems:  Subjective/Chief Complaint No TIA/stroke/seizure No heat or cold intolerance No dysuria/hematuria No blurry or double vision No tinnitus or ear pain No rashes or ulcer Very hard of hearing skin cancers having been resected from left neck and face LE ulcers and pain as above.   Fever/Chills No   Cough No   Sputum No   Abdominal Pain No   Diarrhea No   Constipation No   Nausea/Vomiting No   SOB/DOE No   Chest Pain No   Dysuria No   Tolerating PT Yes   Tolerating Diet Yes   Medications/Allergies Reviewed Medications/Allergies reviewed   Physical Exam:  GEN well developed,  well nourished, no acute distress, thin, elderly male sitting in a chair   HEENT pink conjunctivae, moist oral mucosa, very diminished hearing   NECK No masses  trachea midline  left neck/face deformity from previous cancer resection   RESP normal resp effort  clear BS  no use of accessory muscles   CARD regular rate  no thrills  no JVD   VASCULAR ACCESS none   ABD denies tenderness  soft  normal BS   GU no superpubic tenderness   LYMPH negative neck, negative axillae   EXTR 1+ LE edema bilaterally.  Wounds as above.  Pedal pulses 1+ right PT, NP left PT, 1+ B DP   SKIN positive ulcers, skin turgor good, as above   NEURO cranial nerves intact, follows commands, motor/sensory function intact   PSYCH alert, A+O to time, place, person   LABS:  Laboratory Results: Hepatic:    01-Dec-15 21:02, Comprehensive Metabolic Panel  Bilirubin, Total 0.4  Alkaline Phosphatase 78  46-116  NOTE: New Reference Range  10/23/13  SGPT (ALT) 21  14-63  NOTE: New Reference Range  10/23/13  SGOT (AST) 28  Total Protein, Serum 6.6  Albumin, Serum 3.3  Cardiology:    01-Dec-15 21:45, ED ECG  Ventricular Rate 87  Atrial Rate 87  P-R Interval 186  QRS Duration 94  QT 384  QTc 462  P Axis  72  R Axis -9  T Axis 45  ECG interpretation   Normal sinus rhythm  Cannot rule out Anterior infarct , age undetermined  Abnormal ECG  When compared with ECG of 03-Jun-2008 19:14,  No significant change was found  ----------unconfirmed----------  Confirmed by OVERREAD, NOT ([id number]), editor PEARSON, BARBARA ([id number]) on 03/06/2014 12:59:32 PM  ED ECG   Routine Chem:    01-Dec-15 21:02, Comprehensive Metabolic Panel  Glucose, Serum 113  BUN 24  Creatinine (comp) 1.19  Sodium, Serum 140  Potassium, Serum 4.0  Chloride, Serum 104  CO2, Serum 29  Calcium (Total), Serum 8.8  Osmolality (calc) 284  eGFR (African American) >60  eGFR (Non-African American) >60  eGFR values <38mL/min/1.73 m2 may be an  indication of chronic  kidney disease (CKD).  Calculated eGFR, using the MRDR Study equation, is useful in   patients with stable renal function.  The eGFR calculation will not be reliable in acutely ill patients  when serum creatinine is changing rapidly. It is not useful in  patients on dialysis. The eGFR calculation may not be applicable  to patients at the low and high extremes of body sizes, pregnant  women, and vegetarians.  Anion Gap 7    01-Dec-15 21:02, Hemoglobin A1c ([facility])  Hemoglobin A1c ([facility]) 6.2  The American Diabetes Association recommends that a primary goal of  therapy should be <7% and that physicians should reevaluate the  treatment regimen in patients with HbA1c values consistently >8%.  Cardiac:    01-Dec-15 21:02, Troponin I  Troponin I 0.04  0.00-0.05  0.05 ng/mL or less: NEGATIVE   Repeat testing in 3-6 hrs   if clinically indicated.  >0.05 ng/mL: POTENTIAL   MYOCARDIAL INJURY. Repeat   testing in 3-6 hrs if   clinically indicated.  NOTE: An increase or decrease   of 30% or more on serial   testing suggests a   clinically important change  Routine UA:    01-Dec-15 21:02, Urinalysis  Color (UA) Yellow  Clarity (UA) Clear  Glucose (UA) Negative  Bilirubin (UA) Negative  Ketones (UA) Negative  Specific Gravity (UA) 1.010  Blood (UA) Negative  pH (UA) 7.0  Protein (UA) Negative  Nitrite (UA) Negative  Leukocyte Esterase (UA) Negative  Result(s) reported on 05 Mar 2014 at 09:24PM.  RBC (UA) 2 /HPF  WBC (UA) 1 /HPF  Bacteria (UA)   NONE SEEN  Epithelial Cells (UA)   NONE SEEN   Result(s) reported on 05 Mar 2014 at 09:24PM.  Routine Coag:    01-Dec-15 21:02, Prothrombin Time  Prothrombin 12.7  INR 1.0  INR reference interval applies to patients on anticoagulant therapy.  A single INR therapeutic range for coumarins is not optimal for all  indications; however, the suggested range for most indications is  2.0 - 3.0.  Exceptions to the INR  Reference Range may include: Prosthetic heart  valves, acute myocardial infarction, prevention of myocardial  infarction, and combinations of aspirin and anticoagulant. The need  for a higher or lower target INR must be assessed individually.  Reference: The Pharmacology and Management of the Vitamin K   antagonists: the seventh ACCP Conference on Antithrombotic and  Thrombolytic Therapy. BSJGG.8366 Sept:126 (3suppl): [id number].  A HCT value >55% may artifactually increase the PT.  In one study,   the increase was an average of 25%.  Reference:  "Effect on Routine and Special Coagulation Testing Values  of Citrate Anticoagulant Adjustment in Patients with High HCT Values."  American  Journal of Clinical Pathology 2006;126:400-405.  Routine Hem:    01-Dec-15 21:02, Hemogram, Platelet Count  WBC (CBC) 9.8  RBC (CBC) 3.78  Hemoglobin (CBC) 12.0  Hematocrit (CBC) 37.1  Platelet Count (CBC) 190  Result(s) reported on 05 Mar 2014 at 10:32PM.  MCV 98  MCH 31.7  MCHC 32.3  RDW 13.4   RADIOLOGY:  Radiology Results: XRay:    01-Dec-15 21:25, Hip Left Complete  Hip Left Complete  REASON FOR EXAM:    pain l hip after fall  COMMENTS:       PROCEDURE: DXR - DXR HIP LEFT COMPLETE  - Mar 05 2014  9:25PM     CLINICAL DATA:  Fall, acute left upper leg and hip pain    EXAM:  LEFT HIP - COMPLETE 2+ VIEW    COMPARISON:  06/03/2008    FINDINGS:  Healed rami fractures in the midline bilaterally. Bones are  osteopenic. Hips are symmetric.  Left hip appears intact without malalignment or acute displaced  fracture. No diastases. Degenerative changes of the spine and SI  joints. Postop changes across the pelvis.     IMPRESSION:  Osteopenia and degenerative changes.    No acute osseous finding or malalignment.      Electronically Signed    By: Daryll Brod M.D.    On: 03/05/2014 21:37       Verified By: Earl Gala, M.D.,  [place]:    02-Dec-15 15:27, US Carotid Doppler Bilateral   US Carotid Doppler Bilateral  REASON FOR EXAM:    falls, ataxia  COMMENTS:       PROCEDURE: [place]  - US CAROTID DOPPLER BILATERAL  - Mar 06 2014  3:27PM     CLINICAL DATA:  Falls, ataxia    EXAM:  BILATERAL CAROTID DUPLEX ULTRASOUND    TECHNIQUE:  Pearline Cables scale imaging, color Doppler and duplex ultrasound were  performed of bilateral carotid and vertebral arteries in the neck.    COMPARISON:  Head CT - 03/05/2014  FINDINGS:  Criteria: Quantification of carotid stenosis is based on velocity  parameters that correlate the residual internal carotid diameter  with NASCET-based stenosis levels, using the diameter of the distal  internal carotid lumen as the denominator for stenosis measurement.    The following velocity measurements were obtained:    RIGHT    ICA:  86/13 cm/sec    CCA:  03/49 cm/sec    SYSTOLIC ICA/CCA RATIO:  1.1  DIASTOLIC ICA/CCA RATIO:  1.3    ECA:  78 cm/sec    LEFT    ICA:  126/27 cm/sec    CCA:  82/18 axial 1.6 cm/sec    SYSTOLIC ICA/CCA RATIO:  1.6    DIASTOLIC ICA/CCA RATIO:  1.6    ECA:  143 cm/sec  RIGHT CAROTID ARTERY: There is a minimal amount of eccentric  echogenic plaque throughout the right common carotid artery,  greatest at its mid aspect (image 5). There is a moderate amount of  eccentric mixed echogenic plaque within the right carotid bulb  (image 12), extending to involve the origin and proximal aspect of  the right internal carotid artery (image 18), not resulting in  elevated peak systolic velocities within the interrogated course of  the right internal carotid artery to suggest ahemodynamically  significant stenosis.    RIGHT VERTEBRAL ARTERY:  Antegrade flow    LEFT CAROTID ARTERY: There is a minimal amount of eccentric mixed  echogenic plaque within the distal aspect of the left  common carotid  artery (image 46). There is a moderate amount of eccentric mixed  echogenic partially shadowing plaque within the left carotid  bulb  (image 49, extending to involve the origin and proximal aspect of  the left internal carotid artery (image 56), resulting in borderline  elevated peak systolic velocities within the mid aspect of the left  internal carotid artery (measuring 126 cm/sec - image 61).    LEFT VERTEBRAL ARTERY:  Antegrade flow next     IMPRESSION:  1. Moderate amount of left-sided atherosclerotic plaque results in  borderline elevated peak systolic velocities within the left  internal carotid artery which approach the 50% luminal narrowing  range. Further evaluation with CTA could be performed as clinically  indicated.  2. Minimal to moderate amount of right-sided atherosclerotic plaque,  not resulting in a hemodynamically significant stenosis.      Electronically Signed    By: Sandi Mariscal M.D.    On: 03/06/2014 16:41         Verified By: Aileen Fass, M.D.,  [place]:    01-Dec-15 21:15, CT Head Without Contrast  PACS Image    01-Dec-15 21:25, Hip Left Complete  PACS Image    01-Dec-15 23:52, CT Pelvis Without Contrast  PACS Image    02-Dec-15 15:27, US Carotid Doppler Bilateral  PACS Image  CT:    01-Dec-15 21:15, CT Head Without Contrast  CT Head Without Contrast  REASON FOR EXAM:    confusion after fall  COMMENTS:       PROCEDURE: CT  - CT HEAD WITHOUT CONTRAST  - Mar 05 2014  9:15PM     CLINICAL DATA:  Status post fall; did not hit head. No loss of  consciousness. Concern for head injury. Initial encounter.    EXAM:  CT HEAD WITHOUT CONTRAST    TECHNIQUE:  Contiguous axial images were obtained from the base of the skull  through the vertex without intravenous contrast.  COMPARISON:  CT of the head from 03/18/2005    FINDINGS:  There is no evidence of acute infarction, mass lesion, or intra- or  extra-axial hemorrhage on CT.    Prominence of the ventricles and sulci reflects moderate cortical  volume loss. Scattered periventricular white matter change  likely  reflects small vessel ischemic microangiopathy. Cerebellar atrophy  is noted.    The posterior fossa, including the cerebellum, brainstem and fourth  ventricle, is within normal limits. The third and lateral  ventricles, and basal ganglia are unremarkable in appearance. The  cerebral hemispheres are symmetric in appearance, with normal  gray-white differentiation. No mass effect or midline shift is seen.    There is no evidence of fracture; visualized osseous structures are  unremarkable in appearance. A metallic density is again noted  anterior to the left optic globe. The right orbit is unremarkable.  The paranasal sinuses and mastoid air cells are well-aerated. No  significant soft tissue abnormalities are seen.     IMPRESSION:  1. No evidence of traumatic intracranial injury or fracture.  2. Moderate cortical volume loss and scattered small vessel ischemic  microangiopathy.      Electronically Signed    By: Garald Balding M.D.    On: 03/05/2014 21:35         Verified By: JEFFREY . CHANG, M.D.,    01-Dec-15 23:52, CT Pelvis Without Contrast  CT Pelvis Without Contrast  REASON FOR EXAM:    fall injury, difficulty walking  COMMENTS:  PROCEDURE: CT  - CT PELVIS STANDARD WO  - Mar 05 2014 11:52PM     CLINICAL DATA:  81 year old male with fall and left hip and pelvic  pain. Initial encounter.    EXAM:  CT PELVIS WITHOUT CONTRAST    TECHNIQUE:  Multidetector CT imaging of the pelvis was performed following the  standard protocol without intravenous contrast.  COMPARISON:  03/05/2014 and prior radiographs.    FINDINGS:  There is no evidence of acute fracture, subluxation or dislocation.    Remote fractures of bilateral superior and inferior pubic rami  noted.    Severe degenerative changes in the lower lumbar spine are identified  with unchanged grade 1 spondylolisthesis at L5-S1.    Diffuse osteopenia/osteoporosis noted.    There is no evidence of  free fluid within the pelvis.  The bladder and bowel are unremarkable except for scattered colonic  diverticular.    Evidence of previous inguinal hernia repair noted.     IMPRESSION:  No evidence of acute bonyabnormality.    Remote bilateral pubic fractures and severe degenerative changes  within the lumbar spine noted.      Electronically Signed    By: Hassan Rowan M.D.    On: 03/06/2014 00:39     Verified By: Lura Em, M.D.,   ASSESSMENT AND PLAN:  Assessment/Admission Diagnosis BLE ulcerations, left more worrisome than right. suspected PAD swelling hip pain   Plan He has ulcers on both lower extremities of unkown duration.  he thinks these have been there 3-4 weeks.  He had no trauma to these areas.  They are not really painful.  He has not really noticed them much.  He has an ulcer onthe postrior right calf that is about 4x4.  The more worrisome ulcer is on the left ankle/lower calf area.  This is about 6x5 and has some keratotic skin around this.  it is kind of pale and appears unhelathy. He describes symptoms that are consistent with claudication before the ulcers.  Given his age, the likelihood of PAD is high.  It was discussed with he and his nieces that if PAD is present, wound healing may be poor and this could be a difficult and limb threatening situation.  Arterial duplex not available in our hospital, and given the high clinical suspicion and the possible limb threatening situation, would recommend angiogram of the LLE with possible revascularization.  Discussed risks and benfit and he seems agreeable to proceed.  Will schedule for Monday.      level 5 consult   Electronic Signatures: Algernon Huxley (MD)  (Signed 03-Dec-15 17:55)  Authored: Chief Complaint and History, PAST MEDICAL/SURGICAL HISTORY, ALLERGIES, HOME MEDICATIONS, Family and Social History, Review of Systems, Physical Exam, LABS, RADIOLOGY, Assessment and Plan   Last Updated: 03-Dec-15 17:55 by Algernon Huxley (MD)

## 2014-07-27 NOTE — Op Note (Signed)
PATIENT NAME:  Janalyn RouseDABBS, Gray E MR#:  [id number] DATE OF BIRTH:  01/24/1917  DATE OF PROCEDURE:  03/11/2014  PREOPERATIVE DIAGNOSES:  1. Peripheral arterial disease with ulcer, bilateral lower extremities, with left ulcer being worse on the right.  2. Left lower extremity ankle ulcer.  3. Right lower extremity calf ulcer.   POSTOPERATIVE DIAGNOSES:  1. Peripheral arterial disease with ulcer, bilateral lower extremities, with left ulcer being worse on the right.  2. Left lower extremity ankle ulcer.  3. Right lower extremity calf ulcer.   PROCEDURES:  1. Ultrasound guidance for vascular access, right femoral artery.  2. Catheter placement into left posterior tibial artery from right femoral approach.  3. Aortogram and selective left lower extremity angiogram.  4. Percutaneous transluminal angioplasty of entire posterior tibial artery from the foot to the tibioperoneal trunk and the tibioperoneal trunk with a 3 mm diameter conventional angioplasty balloon.  5. Percutaneous transluminal angioplasty of proximal posterior tibial artery and distal tibioperoneal trunk with 4 mm diameter drug-coated angioplasty balloon.  6. StarClose closure device, right femoral artery.   SURGEON: Annice NeedyJason S. Japji Kok, M.D.   ANESTHESIA: Local with moderate conscious sedation.   ESTIMATED BLOOD LOSS: 25 mL.   INDICATION FOR PROCEDURE: This is a 81 year old gentleman with ulcers of both lower extremities His left ankle ulcer the more worrisome-appearing ulcer. He was admitted for lower extremity pain. His clinical exam was consistent with potential peripheral vascular disease and he is brought in for evaluation with angiography for further evaluation. Risks and benefits were discussed. Informed consent was obtained.   DESCRIPTION OF PROCEDURE: The patient is brought to the vascular suite. Groins were shaved and prepped and a sterile surgical field was created. The right femoral artery was localized with fluoroscopy. Right  femoral artery is visualized with ultrasound and found to be patent. It was then accessed under direct ultrasound guidance without difficulty with Seldinger needle. A J-wire and 5 French sheath were placed. Pigtail catheter was placed in the aorta at the L1 level. AP aortogram was performed. 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 The iliacs were very tortuous, but not stenotic. I then crossed the aortic bifurcation and advanced to the left femoral head and selective left lower extremity angiogram was then performed. This demonstrated patent left distal external carotid common femoral artery, profunda femoris artery, superficial femoral artery. There was some diffuse calcific disease. The popliteal artery was calcific, but not stenosed. There was a normal treated tibial trifurcation. The peroneal artery initially was the only runoff distally. It was continuous, although it was somewhat small. The posterior tibial artery had multiple areas of high-grade stenosis or short segment occlusion. I heparinized the patient by giving 4000 units of intravenous heparin. A 6 French Ansell sheath was placed over a Truman advantage wire and I was able to get down into the tibials without difficulty. Using the [facility] advantage wire and a Kumpe catheter, I navigated through these stenoses and occlusion of the posterior tibial artery, and exchanged for an 0.018 wire, which was then parked in the foot. A 3 mm diameter angioplasty balloon was inflated throughout the entire posterior tibial artery from the foot to the tibioperoneal trunk and then up to the distal popliteal artery. Following this there is marked improvement, but there was still a high-grade residual stenosis of the proximal posterior tibial artery and distal tibioperoneal trunk. I treated this area with a 4 mm diameter Lutonix  drug-coated angioplasty balloon. Following  this, there was good angiographic completion result. There was some spasm distally, but there did not appear to be flow-limiting residual stenosis. The peroneal artery remained intact. He now had two vessel runoff, and  I elected to terminate the procedure. The sheath was pulled back to the ipsilateral external iliac artery and oblique area was performed. StarClose closure device was deployed in the usual fashion with excellent hemostatic result. The patient tolerated the procedure well and was taken to the recovery room in stable condition.     ____________________________ Annice Needy, MD jsd:ap D: 03/11/2014 10:28:00 ET T: 03/11/2014 10:49:52 ET JOB#: [id number] Annice Needy MD ELECTRONICALLY SIGNED 04/04/2014 14:15

## 2014-07-27 NOTE — H&P (Signed)
PATIENT NAME:  Philip Norman, Philip Norman MR#:  [id number] DATE OF BIRTH:  Aug 27, 1916  DATE OF ADMISSION:  03/26/2014  REFERRING PHYSICIAN:  Eartha Inch. York Cerise, MD  PRIMARY CARE PHYSICIAN:  Kandyce Rud, MD  CHIEF COMPLAINT:  Short of breath.   HISTORY OF PRESENT ILLNESS:  This is a 81 year old Caucasian gentleman residing at [facility] with a history of hypertension and hyperlipidemia, presenting with shortness of breath, recently discharged from [facility] on 03/13/2014, with discharge diagnosis of multilobar pneumonia, discharged on 2 liters nasal cannula new baseline as well as Levaquin for antibiotic coverage. While at [facility], they noticed cough for the last 2-day duration; however, it was nonproductive. He had associated shortness of breath and subjective chills. Denies any fevers. He was noted to be desaturating despite 2 liters nasal cannula and was sent to the hospital for further workup and evaluation. Upon arrival to the Emergency Room, he required 4 liters nasal cannula to maintain oxygen saturations greater than 92%.   REVIEW OF SYSTEMS:  CONSTITUTIONAL:  Positive for fatigue, weakness, and chills. Denies fever.  EYES:  Denies blurred vision, double vision, or eye pain.  EARS, NOSE, AND THROAT:  Denies tinnitus, ear pain, or hearing loss.  RESPIRATORY:  Positive for cough and shortness of breath. Denies any wheezing.  CARDIOVASCULAR:  Denies chest pain, palpitations, or edema.  GASTROINTESTINAL:  Denies nausea, vomiting, diarrhea, or abdominal pain.  GENITOURINARY:  Denies dysuria or hematuria.  ENDOCRINE:  Denies nocturia or thyroid problems.  Hematologic and lymphatic:  Denies easy bruising or bleeding.  SKIN:  Denies rashes or lesions.  MUSCULOSKELETAL:  Denies pain in the neck, back, shoulder, knees, or hips or arthritic symptoms.  NEUROLOGIC:  Denies any paralysis or paresthesias.  PSYCHIATRIC:  Denies anxiety or depressive symptoms.   Otherwise, a full review of  systems performed by me is negative.   PAST MEDICAL HISTORY:  Essential hypertension, hyperlipidemia unspecified, peripheral vascular disease recently diagnosed, as well as recent multilobar pneumonia.   SOCIAL HISTORY:  Currently resides at [facility]. Uses a walker for ambulation. No alcohol, tobacco, or drug usage.   FAMILY HISTORY:  No known cardiovascular or pulmonary disorders.   ALLERGIES:  No known drug allergies.   HOME MEDICATIONS:  Include acetaminophen 325 two tabs p.o. q. 4 hours as needed for pain, acetaminophen/hydrocodone 325/5 mg p.o. q. 6 hours as needed for pain, aspirin 81 mg p.o. daily, lisinopril 20 mg p.o. daily, collagenase 250 units per gram topical once daily, Colace 100 mg p.o. b.i.d. as needed for constipation, senna 1 tab b.i.d. as needed for constipation, Florastor 250 mg p.o. daily for 14-day course, and Levaquin, which he has finished his dosages of.   PHYSICAL EXAMINATION: VITAL SIGNS:  Temperature 98.3, heart rate 81, respirations 18, blood pressure 120/68, saturating 94% on 4 liters nasal cannula. Weight 60.9 kg, BMI 19.3. On arrival, his respiratory rate was 27.  GENERAL:  Weak, frail Caucasian gentleman, currently in minimal distress given respiratory status.  HEAD:  Normocephalic, atraumatic.  EYES:  Pupils are equal, round, and reactive to light. Extraocular muscles are intact. No scleral icterus.  MOUTH:  Dry mucosal membranes. Dentition is intact. No abscess noted.  EARS, NOSE, AND THROAT:  Clear without exudates. No external lesions.  NECK:  Supple. No thyromegaly. No nodules. No JVD.  PULMONARY:  Diffuse coarse breath sounds bilaterally with scattered rhonchi. No use of accessory muscles, however, tachypneic. Good respiratory effort.  CHEST:  Nontender to palpation.  CARDIOVASCULAR:  S1 and  S2, regular rate and rhythm. No murmurs, rubs, or gallops. No edema. Pedal pulses are 2+ bilaterally.  GASTROINTESTINAL:  Soft, nontender, nondistended. No  masses. Positive bowel sounds. No hepatosplenomegaly. MUSCULOSKELETAL:  No swelling, clubbing, or edema. Range of motion is full in all extremities.  NEUROLOGIC:  Cranial nerves II through XII are intact. No gross focal neurologic deficits. Sensation is intact. Reflexes are intact.  SKIN:  There is a currently covered ulceration on the left leg just above the medial malleoli. There is also dependent edema in both legs in the calves 1 to 2+. Otherwise, no further lesions, rashes, or cyanosis. Skin is warm and dry. Turgor is poor.  PSYCHIATRIC:  Mood and affect are within normal limits. The patient is awake, alert, and oriented x 3. Insight and judgment are intact.   LABORATORY DATA:  Sodium 144, potassium 4.3, chloride 102, bicarbonate 35, BUN 22, creatinine 1.16, glucose 79. LFTs:  Total protein 6.7, albumin 2.1, bilirubin 0.3, alkaline phosphatase 178, otherwise LFTs within normal limits. Troponin I is 0.08. WBC is 14.1, hemoglobin 12, and platelets are 340,000. Urinalysis is negative for evidence of infection.   Chest x-ray performed, which reveals progression of multilobar airspace consolidation most typical for pneumonia.   ASSESSMENT AND PLAN:  This is a 81 year old Caucasian gentleman with history of essential hypertension and hyperlipidemia unspecified, presenting with shortness of breath, recently discharged from the hospital on 03/13/2014, with discharge diagnosis of multilobar pneumonia.   1.  Healthcare-associated pneumonia. Supplemental oxygen to keep oxygen saturation greater than 92%. DuoNeb treatments q. 4 hours. Solu-Medrol 60 mg IV daily. Broad-spectrum antibiotic coverage with currently receiving Levaquin, Zosyn, and vancomycin; we will continue these as they were initiated in the Emergency Department. We will follow up culture data and adjust antibiotics accordingly.  2.  Elevated troponin. Place on telemetry. Trend cardiac enzymes x 3.  3.  Hypertension. Continue lisinopril.  4.   Hyperlipidemia. Statin therapy.  5.  Venous thromboembolism prophylaxis with heparin subcutaneously.   CODE STATUS:  The patient is DO NOT RESUSCITATE as discussed with him and his son at bedside.   TIME SPENT:  45 minutes.    ____________________________ Cletis Athensavid K. Kahari Critzer, MD dkh:nb D: 03/27/2014 00:17:15 ET T: 03/27/2014 01:12:02 ET JOB#: [id number]  cc: Cletis Athensavid K. Wava Kildow, MD, <Dictator> Caden Fukushima Synetta ShadowK Andray Assefa MD ELECTRONICALLY SIGNED 03/27/2014 13:43

## 2014-07-27 NOTE — H&P (Signed)
PATIENT NAME:  Philip Norman, [name] MR#:  [id number] DATE OF BIRTH:  1916/12/07  DATE OF ADMISSION:  03/06/2014  REFERRING PHYSICIAN:  Zayante SinkJade J. Dolores FrameSung, MD   PRIMARY CARE PHYSICIAN:  Kandyce RudMarcus Babaoff, MD.  CHIEF COMPLAINT: 1.  Left hip pain, acute on chronic.  2.  Status post fall secondary due to gait disturbances.    HISTORY OF PRESENT ILLNESS:  This is a 81 year old Caucasian male, a pleasant person, who was brought by EMS with the complaints of left hip pain, which is acute on chronic, and he sustained a fall secondary due to gait disturbances and imbalances last evening. The patient's nephew is at his bedside at this time. The patient is hard of hearing. The history I obtained is from the EMS note, ED physician chart, the patient, and the patient's nephew. No history of any chest pain, shortness of breath, dizziness, or loss of consciousness prior to the event. According to the patient and the patient's nephew, the patient had severe pain in the left hip prior to the fall, and he was trying to walk with a walker and lost his balance and fell down with injury to the left hip. EMS was called for further help and brought the patient to the Emergency Room for further evaluation.   The patient has a history of frequent falls secondary due to gait disturbances, and he uses a walker at home. In the Emergency Room, the patient was evaluated by the ED physician and underwent a workup, which involved x-ray of the left hip, which is negative for any acute fractures or dislocations, but positive for some osteopenia and degenerative changes. In view of significant pain, the patient also underwent a CT of the pelvis, which again showed negative for any acute fractures or dislocations, but showed severe degenerative disease in the lumbar spine as well as in other pelvic bones. In view of the patient not able to walk because of the pain, the hospitalist service was consulted for further evaluation and management. As noted  earlier, this was purely a mechanical fall, and the patient did not have any chest pain, shortness of breath, dizziness, or loss of consciousness. No recent illnesses, fever, cough, cold, nausea, vomiting, diarrhea, or abdominal pain. Denies any urinary complaints. The patient is comfortably lying in the bed at this time. He states he feels pain only on movement of the left lower extremity, but otherwise he is pain-free when he is not moving his legs.   PAST MEDICAL HISTORY:  1.  Hypertension.  2.  Hyperlipidemia.  3.  History of skin cancer status post excision on the left side of the face.  4.  Impaired hearing, uses bilateral hearing aids.   PAST SURGICAL HISTORY:  1.  Bilateral hernia repair.  2.  Bilateral cataract surgery with lens implantation.  3.  Excision of skin cancer lesion of the left side of the face many years ago.   HOME MEDICATIONS: 1.  Lisinopril 10 mg tablet 1 tablet orally once a day.  2.  Aspirin 81 mg 1 tablet orally once a day.   ALLERGIES:  No known drug allergies.   FAMILY HISTORY:  Positive for some cancers.   SOCIAL HISTORY:  He is single and lives alone. No history of any smoking, alcohol, or drug usage. He is ambulatory at home with the help of a walker, but history of frequent falls lately.   REVIEW OF SYSTEMS: CONSTITUTIONAL:  Negative for fever, fatigue, or generalized weakness. No excessive weight gain  or weight loss.  EYES:  Negative for blurred vision or double vision. No pain. No redness. No inflammation.  EARS, NOSE, AND THROAT:  Negative for tinnitus or ear pain. He does have chronic impaired hearing and uses hearing aids bilaterally. No epistaxis. No nasal discharge. No difficulty swallowing.  RESPIRATORY:  Negative for cough, wheezing, hemoptysis, or dyspnea.  CARDIOVASCULAR:  Negative for chest pain, shortness of breath, orthopnea, dyspnea on exertion, pedal edema, palpitations, or syncope.  GASTROINTESTINAL:  Negative for nausea, vomiting,  diarrhea, abdominal pain, hematemesis, melena, or rectal bleeding.  GENITOURINARY:  Negative for dysuria, hematuria, frequency, or urgency.  ENDOCRINE:  Negative for polyuria or nocturia. No heat or cold intolerance.  HEMATOLOGIC:  Negative for anemia, easy bruising or bleeding, or swollen glands.  INTEGUMENTARY:  Negative for acne. He does have a chronic ulcer on the left lower extremity on the medial side for the past 5 to 6 weeks, for which he has not sought any medical attention. History of skin cancer in the past.  MUSCULOSKELETAL:  History of arthritis in various joints, especially involving the spine and the lower extremities, for which he takes over-the-counter pain medications as needed. He developed acute left hip pain, following which he tried to walk with a walker, and because of imbalance, he fell down.  NEUROLOGICAL:  Negative for focal weakness or numbness. No history of CVA, TIA, or seizure disorder.  PSYCHIATRIC:  Negative for anxiety, insomnia, or depression.   PHYSICAL EXAMINATION: VITAL SIGNS:  On arrival to the Emergency Room, temperature 97.9 degrees Fahrenheit, pulse rate 79 per minute, respirations 16 per minute, blood pressure 152/84, oxygen saturation 96% on room air. Current vital signs:  Pulse 75 per minute and regular, respirations 15 per minute, blood pressure 132/106, pulse oximetry 95% on room air.  GENERAL:  Well nourished, well built, alert and oriented x 3, pleasant and cooperative, and in no acute distress. The patient is hard of hearing.  HEAD:  Atraumatic, normocephalic.  EYES:  Pupils are equal and reactive to light. No conjunctival pallor. No scleral icterus. Extraocular movements are intact.  NOSE:  No nasal lesions. No drainage.  EARS:  Impaired hearing. No drainage. No external lesions. Bilateral hearing aids present.  ORAL CAVITY:  No mucosal lesions. No exudates.  NECK:  Supple. No JVD. No thyromegaly. No carotid bruit. Range of motion of the neck is  normal.  RESPIRATORY:  Good respiratory effort. Not using accessory muscles of respiration. Bilateral vesicular breath sounds present. No rales or rhonchi appreciated.  CARDIOVASCULAR:  S1 and S2, regular. No murmurs appreciated. No gallops. No clicks. Peripheral pulses are equal at carotid, femoral, and pedal pulses. No peripheral edema.  GASTROINTESTINAL:  Abdomen is soft and nontender. No hepatosplenomegaly. Bowel sounds present and equal in all 4 quadrants. No guarding. No rigidity. No rebound.  GENITOURINARY:  Deferred.  MUSCULOSKELETAL:  Gait not tested. Range of motion of left hip is decreased secondary due to pain. The patient is able to move all 4 extremities. Mild tenderness is present over the left hip and the lower spine. Syndactyly of the right thumb. Strength and tone are equal bilaterally.  SKIN:  Chronic nonhealing ulcer of the left lower extremity just above the medial malleolus, which has been present for 5 to 6 weeks. No evidence of any acute infection.  LYMPHATIC:  No cervical lymphadenopathy.  VASCULAR:  Good dorsalis pedis and posterior tibial pulses.  NEUROLOGICAL:  Alert, awake, and oriented x 3. Cranial nerves II through XII are grossly  intact. Deep tendon reflexes are 2+ bilaterally and symmetrical in both upper and lower extremities. Muscle strength is 4/4 in both upper and lower extremities.   PSYCHIATRIC:  Judgment and insight are adequate. Alert and oriented x 3. Memory and mood are within normal limits.   LABORATORY DATA:  Serum glucose is 113, BUN 24, creatinine 1.19, serum sodium 140, potassium 4.0, chloride 104, bicarbonate 29, total calcium 8.8, total protein 6.6, serum albumin 3.3, total bilirubin 0.4, alkaline phosphatase 78, AST 28, and ALT is 21. Troponin is 0.04. WBC is 9.8, hemoglobin 12.0, hematocrit 37.1, and platelet count is 190,000. Protime is 12.7, INR 1.0. Urinalysis is unremarkable.   IMAGING STUDIES:  X-ray of the left hip:  Osteopenia and degenerative  changes. No acute osseous findings or malalignment.   CT of the pelvis:  No evidence of acute fracture, subluxation, or dislocation. Remote fractures of the bilateral superior inferior pubic ramus. Severe degenerative changes in the lower lumbar spine are identified with unchanged grade 1 spondylolisthesis at L5-S1. Diffuse osteopenia/osteoporosis noted.   CT of the head, noncontrast study:  No evidence of traumatic intracranial injury or fracture.  Moderate cortical volume loss and scattered small vessel ischemic microangiopathy.   EKG:  Normal sinus rhythm with ventricular rate of 87 beats per minute. No acute ST-T changes.   ASSESSMENT AND PLAN:  This is a 81 year old Caucasian male with a past medical history of hypertension, hyperlipidemia, impaired hearing, and history of gait disturbances,  presents to the Emergency Room with the complaints of acute-on-chronic right hip pain and is status post mechanical fall.   1.  Left hip pain, acute on chronic, with history of fall, which was mechanical.  X-ray of the left hip and CT of the pelvis are negative for any acute fracture, but significant lumbar spinal degenerative disease.  Plan: Admit to medical floor. Pain control measures. PT consultation for help and assistance in ambulation and fall precautions.  2.  Status post fall, which was mechanical secondary due to imbalance and gait disturbances. The patient uses a walker at home. We will request a PT consultation for help and assistance in evaluation of gait. 3.  Hypertension, controlled on lisinopril 10 mg daily. Continue same.  4.  Chronic skin ulcer of left lower extremity in a patient with a history of skin cancer status post excision. Needs dermatologic evaluation as an inpatient versus outpatient. We will ask wound care nurse for evaluation.  5.  Impaired hearing, uses bilateral hearing aids. Continue same and supportive care.  6.  Deep vein thrombosis prophylaxis with subcutaneous  Lovenox.  7.  Gastrointestinal prophylaxis with ranitidine.   CODE STATUS:  Full code.   TIME SPENT:  55 minutes.    ____________________________ Crissie Figures, MD enr:nb D: 03/06/2014 01:42:16 ET T: 03/06/2014 02:25:12 ET JOB#: [id number]  cc: Crissie Figures, MD, <Dictator> Kandyce Rud, MD Crissie Figures MD ELECTRONICALLY SIGNED 03/06/2014 14:10

## 2014-07-31 NOTE — Discharge Summary (Signed)
PATIENT NAME:  Philip Norman, Philip Norman MR#:  [id number] DATE OF BIRTH:  [birth year]  DATE OF ADMISSION:  03/26/2014 DATE OF TRANSFER DISCHARGE:  04/26/2014   ADMITTING DIAGNOSIS: Healthcare associated pneumonia.   DISCHARGE DIAGNOSES:  1.  Acute respiratory failure with hypoxia and hypercarbia likely from acute respiratory distress syndrome secondary to sepsis and bilateral multilobar healthcare associated pneumonia, currently on high flow oxygen.  2.  Bilateral pneumonia due to unknown bacteria versus recurrent aspiration suspecting aspiration pneumonitis and acute respiratory distress syndrome.   3.  Dysphagia.  4.  Elevated troponin due to demand ischemia.  5.  Hypotension.  6.  Acute kidney injury, close to baseline with IV fluids. 7.  Generalized weakness. 8.  Syncopal episodes related to hypoxia.  9.  History of hypertension, hyperlipidemia, peripheral vascular disease, left hip contusion, anemia, history of metastatic squamous cell cancer, status post total thyroidectomy, and radiation therapy, history of pelvic fracture, status post repair, history of recent hospitalization from 03/07/2014 to 03/13/2014 with multilobar pneumonia and readmission for the same.   DISCHARGE CONDITION:  Poor.   CODE STATUS: DO NOT RESUSCITATE.   Please review interim discharge summary dictated by Dr.Vaickute 04/04/2014.   RADIOLOGICAL STUDIES: As per Dr.Vaickute's  interim discharge summary.   HISTORY AND HOSPITAL COURSE: Please review Dr. Arlys John history and hospital course regarding details. No new changes or events during the hospital course on 04/04/2014 and 04/25/2014.  The patient is still on high flow oxygen, still on Zosyn and levofloxacin for antibiotics and IV steroids.  The patient was given 1 liter of IV fluids regarding renal insufficiency and lisinopril is held for hypotension. The patient was afebrile and wants to continue the current management. He and his nephew had a discussion with the [facility]  nurse and they are agreeable to get transferred to long-term acute care facility in [place] regarding continuation of care.    PHYSICAL EXAMINATION:  VITAL SIGNS: Temperature 98.2, pulse 95, respirations 18 to 19, blood pressure 102/54, pulse ox 92% on high flow oxygen.  GENERAL APPEARANCE: Weak and debilitated, not critically ill.   HEENT: Pale conjunctiva, hard of hearing, normocephalic, atraumatic.  NECK: Supple. No JVD.  LUNGS: Diffuse rhonchi and crackles are present with diminished air entry. Multiple rales.    CARDIOVASCULAR: Regular, rate and rhythm.  No JVD.  GASTROINTESTINAL: Soft. Bowel sounds are positive in all 4 quadrants. Nontender, nondistended. SKIN:  With no rashes, bruises are present.  EXTREMITIES: No cyanosis. No clubbing. No edema.  NEUROLOGIC: Hard of hearing but follows commands. Motor and sensory intact.  PSYCHIATRIC: Awake and alert with poor insight.   SIGNIFICANT LABORATORIES AND IMAGING STUDIES: On 04/06/2014 glucose 193, BUN 51, creatinine 1.74.  Sodium 146, potassium 2.9. He was given 60 mEq of potassium supplements and received IV fluids with potassium supplements, chloride 100, CO2 of 42. Anion gap is 4. GFR 39. Serum osmolality normal. Calcium 7.6. Magnesium is 1.9. On 04/04/2014, white count is at 24.7, the elevated white count is probably from IV steroids,  hematocrit 31.8, platelets are 181,000.  MEDICATIONS AT THE TIME OF DISCHARGE:Aspirin 81 mg once daily, collagenase ointment topically once daily, Tylenol 325 mg 2 tablets p.o. every 4 hours as needed for mild pain or temperature greater than 100.4 degrees Fahrenheit, Colace 100 mg 1 capsule p.o. 2 times a day as needed for constipation, levofloxacin 750 mg IV every 48 hours, Solu-Medrol 40 mg IV every 8 hours, morphine 20 mg per mL 0.25 p.o. every 4 hours as needed for moderate  pain or dyspnea. Heparin 5000 units subcutaneous every 8 hours.  Zofran 4 mg IV every 4 hours as needed for nausea and vomiting,  albuterol nebulizer treatments every 4 hours as needed for wheezing, albuterol ipratropium every 6 hours, Ensure Plus 3 times a day with meals, Zosyn 3.375 mg IV every 8 hours, potassium chloride 20 mEq p.o. 2 times a day.  We stopped the lisinopril, Tylenol, hydrocodone.   DIET: Low-fat, low-cholesterol, dietary supplement Ensure Plus 3 times a day. Diet consistency as recommended by dietitian. Mechanical soft with nectar thick liquids with no straws.   ACTIVITY: As tolerated.   FOLLOW-UP:  With primary care physician after discharge.   CODE STATUS:  He is DO NOT RESUSCITATE.   Diagnosis and plan of care was discussed in detail with the patient and his nephew at bedside. They both verbalized understanding of the plan. Please review history and physical and hospital course Dictated by Dr.Vaickute for details.   TOTAL TIME SPENT ON THE DISCHARGE AND COORDINATION OF CARE:  45 minutes.     ____________________________ Philip LabAruna Raveena Hebdon, MD ag:DT D: 05/04/2014 12:32:52 ET T: 04/25/2014 13:24:28 ET JOB#: [id number]  cc: Philip LabAruna Anyjah Roundtree, MD, <Dictator> Philip LabARUNA Saquoia Sianez MD ELECTRONICALLY SIGNED 04/15/2014 22:12

## 2015-12-10 IMAGING — CR DG CHEST 1V PORT
1 series · 1 of 1 positions shown · non-contrast
Comparison: 03/11/2004 chest CT, chest radiograph 03/10/2014

CLINICAL DATA: Shortness of breath, hypoxia

EXAM:
PORTABLE CHEST - 1 VIEW

[ap]
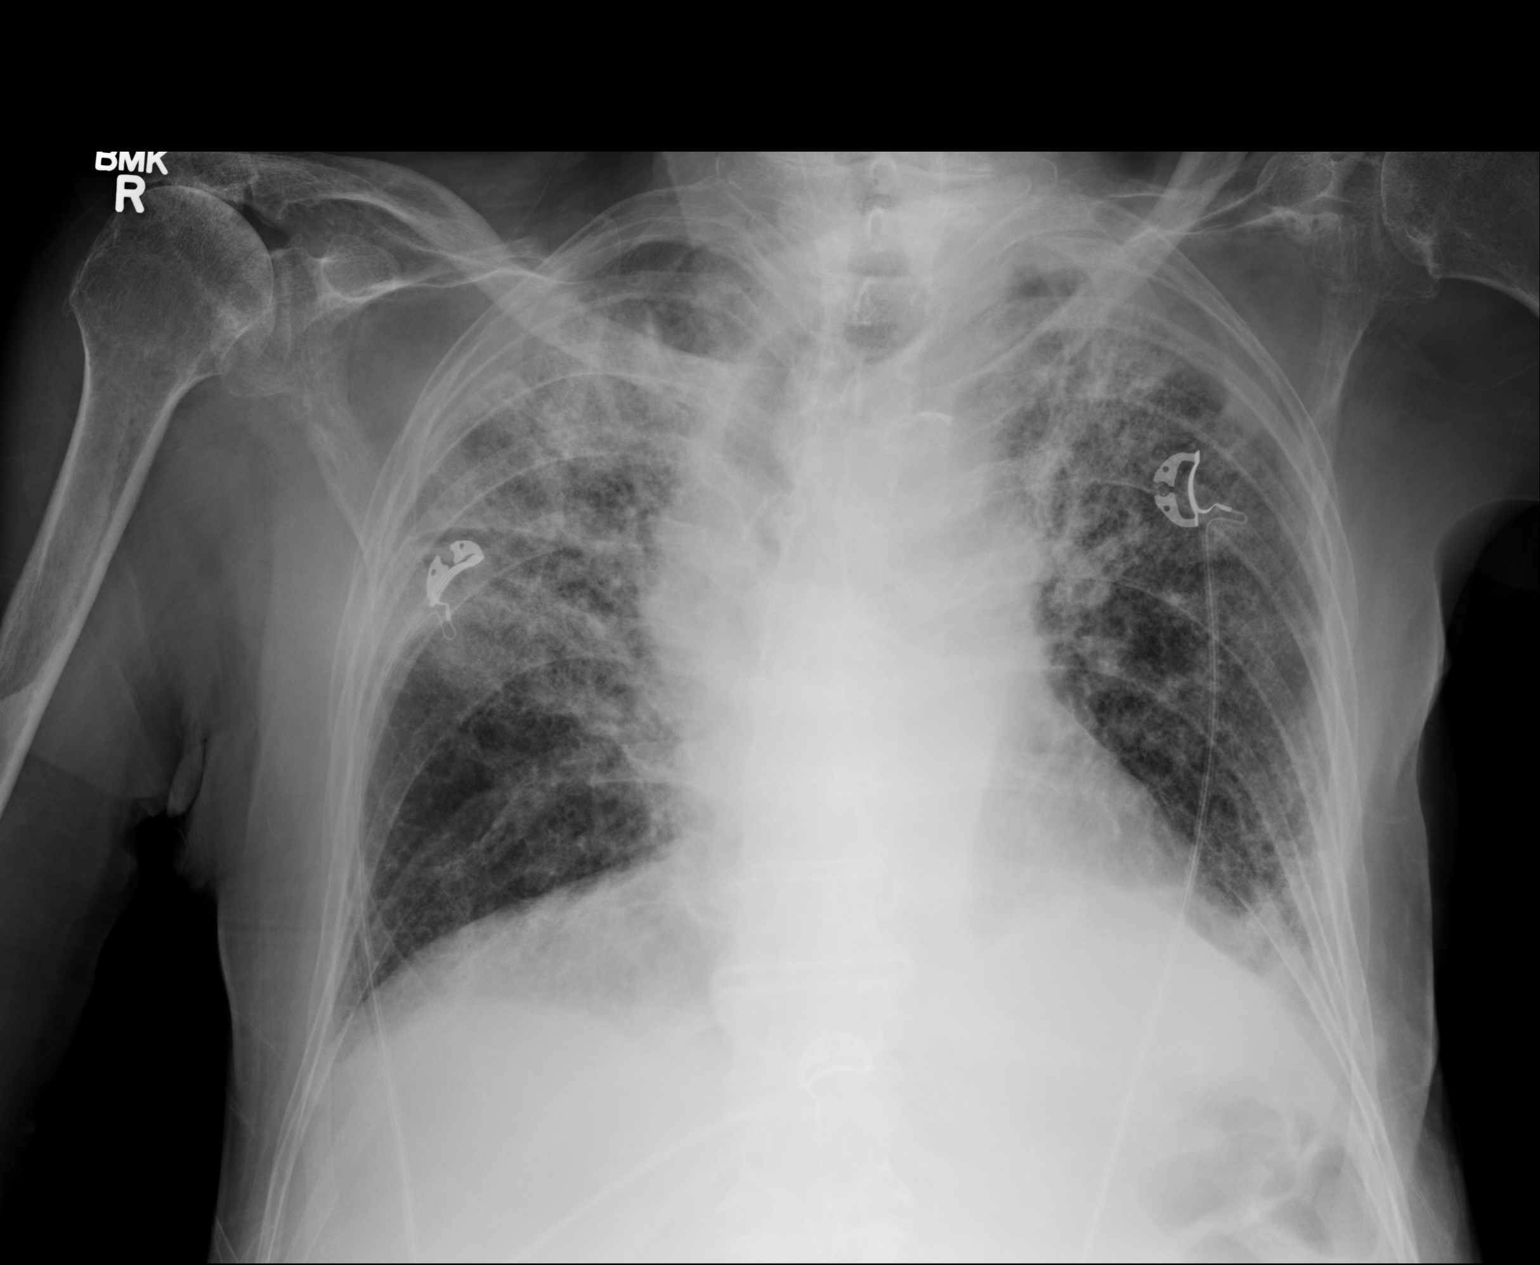

[1 of 1 positions shown; findings below may reference images not displayed]

FINDINGS: There has been interval increase in patchy bilateral upper lobe
airspace opacities, with opacity reidentified in the superior
segment right lower lobe and left lower lobe, also increased. Trace
pleural effusions are noted. Mild cardiomegaly is present. No acute
osseous finding. Bilateral shoulder degenerative change noted.
IMPRESSION: Progression of multi lobar airspace consolidation most typical for
pneumonia, although asymmetric edema or other alveolar filling
processes could appear similar.

## 2015-12-11 IMAGING — CR DG CHEST 1V PORT
1 series · 1 of 1 positions shown · non-contrast
Comparison: 03/26/2014

CLINICAL DATA: Shortness of breath.  Pneumonia.

EXAM:
PORTABLE CHEST - 1 VIEW

[ap]
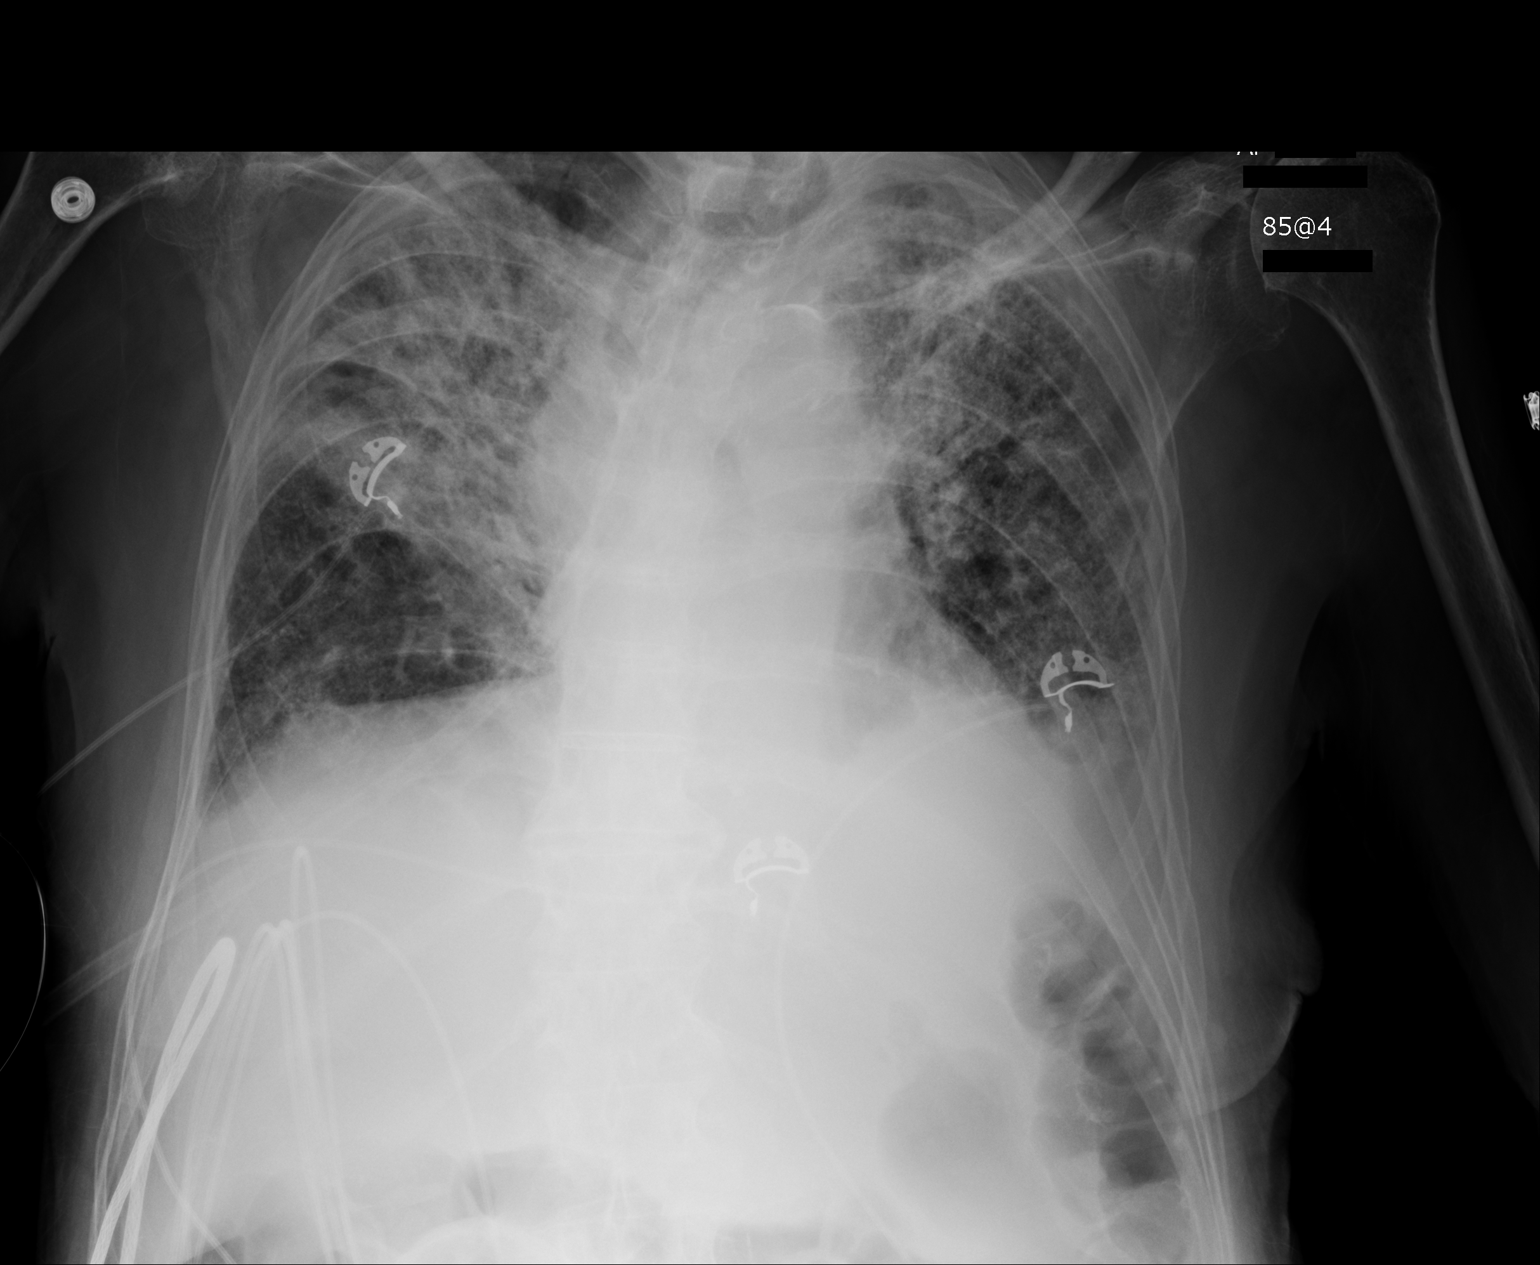

[1 of 1 positions shown; findings below may reference images not displayed]

FINDINGS: Low lung volumes are present, causing crowding of the pulmonary
vasculature. Accounting for the low lung volumes, the bilateral
airspace opacities appear stable.

Atherosclerotic aortic arch. No overt cardiomegaly. Bony
demineralization. The patient is rotated to the left on today's
radiograph, reducing diagnostic sensitivity and specificity. Blunted
left costophrenic angle. Thoracic spondylosis. Probable chronic
rotator cuff tears based on eliminated acromion humeral distances.
IMPRESSION: 1. Stable bilateral airspace opacities favoring multi lobar
pneumonia.
2. Suspected small of pleural effusion.
3. Low lung volumes.

## 2015-12-14 IMAGING — CR DG CHEST 1V PORT
1 series · 1 of 1 positions shown · non-contrast
Comparison: 03/27/2014, 03/10/2014 and 06/03/2008

CLINICAL DATA: Fever, shortness of breath and hypoxia with
pneumonia.

EXAM:
PORTABLE CHEST - 1 VIEW

[ap]
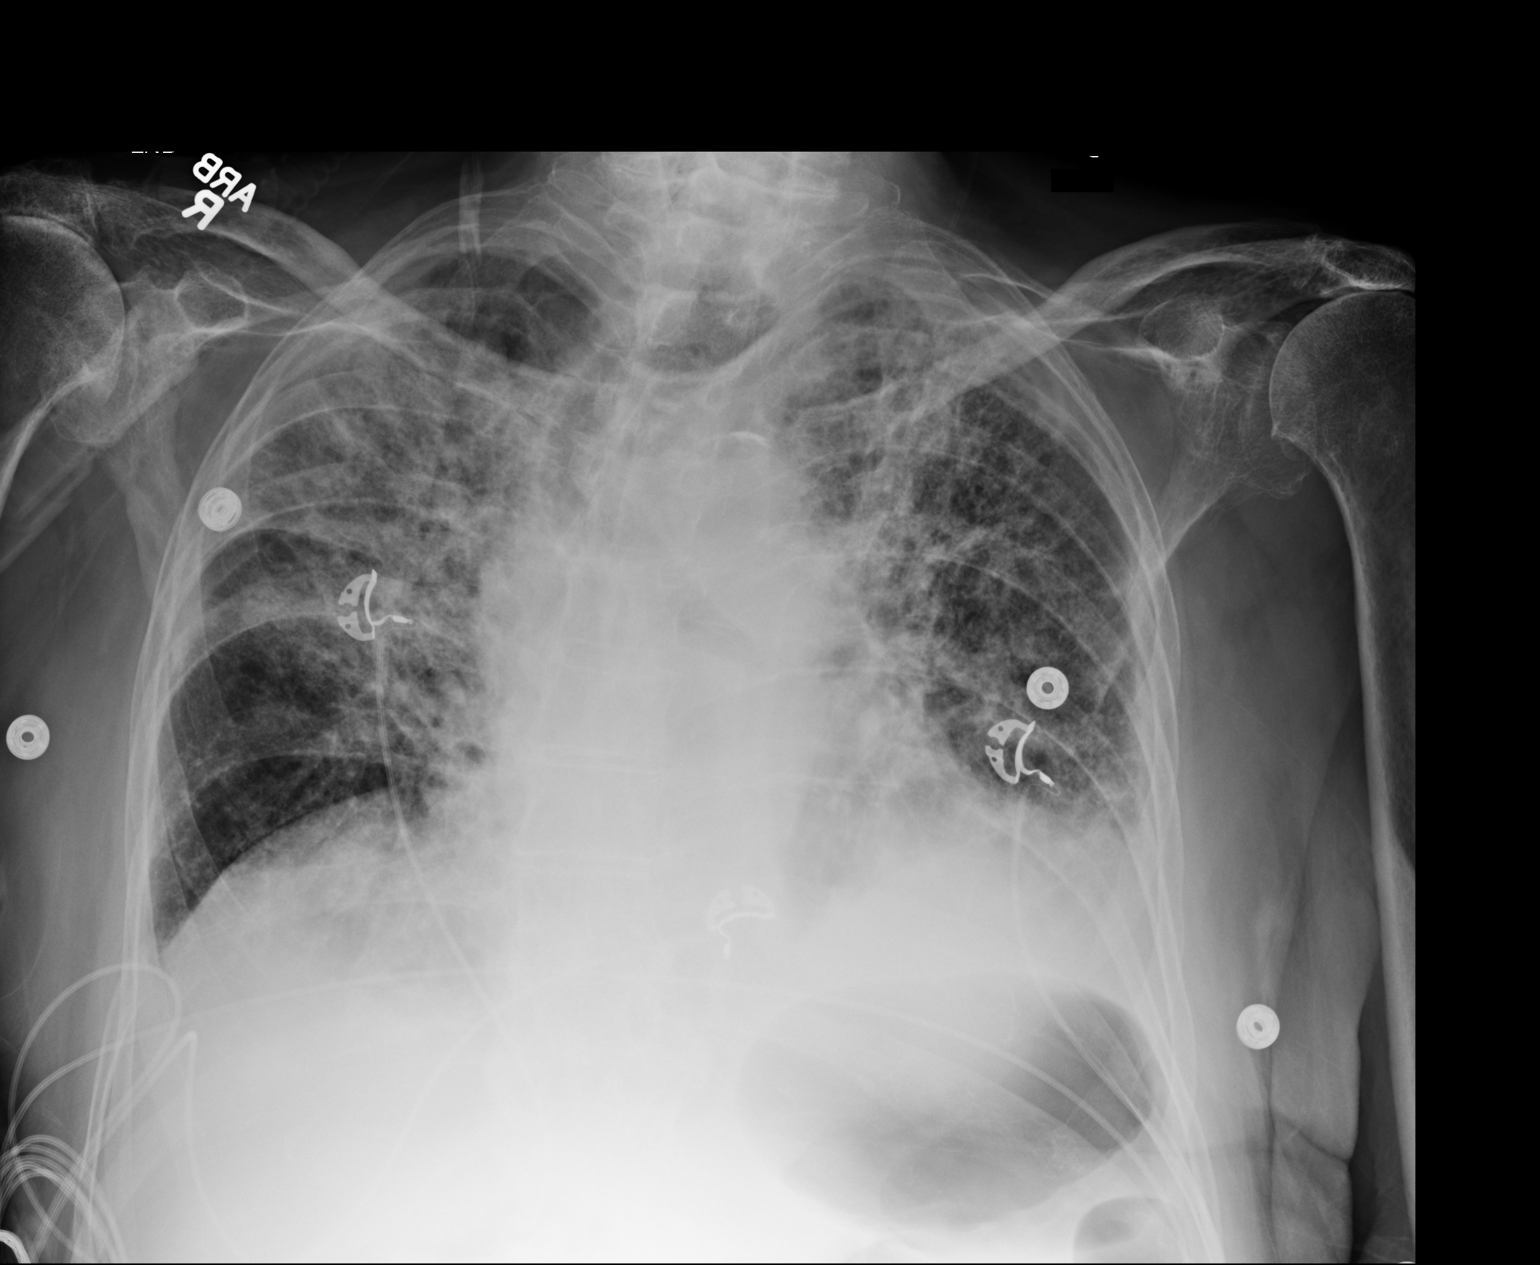

[1 of 1 positions shown; findings below may reference images not displayed]

FINDINGS: Exam demonstrates low lung volumes with bilateral multifocal
airspace process most prominent over the right mid upper lung as
these findings are not significantly changed and likely represent a
pneumonia. Small left effusion unchanged. Cardiomediastinal
silhouette and remainder of the exam is unchanged.
IMPRESSION: Stable bilateral multifocal airspace process likely a multifocal
pneumonia. Small left effusion unchanged.

## 2015-12-21 IMAGING — CR DG CHEST 1V PORT
1 series · 1 of 1 positions shown · non-contrast
Comparison: Same day.

CLINICAL DATA: Status post PICC line placement.

EXAM:
PORTABLE CHEST - 1 VIEW

[AP]
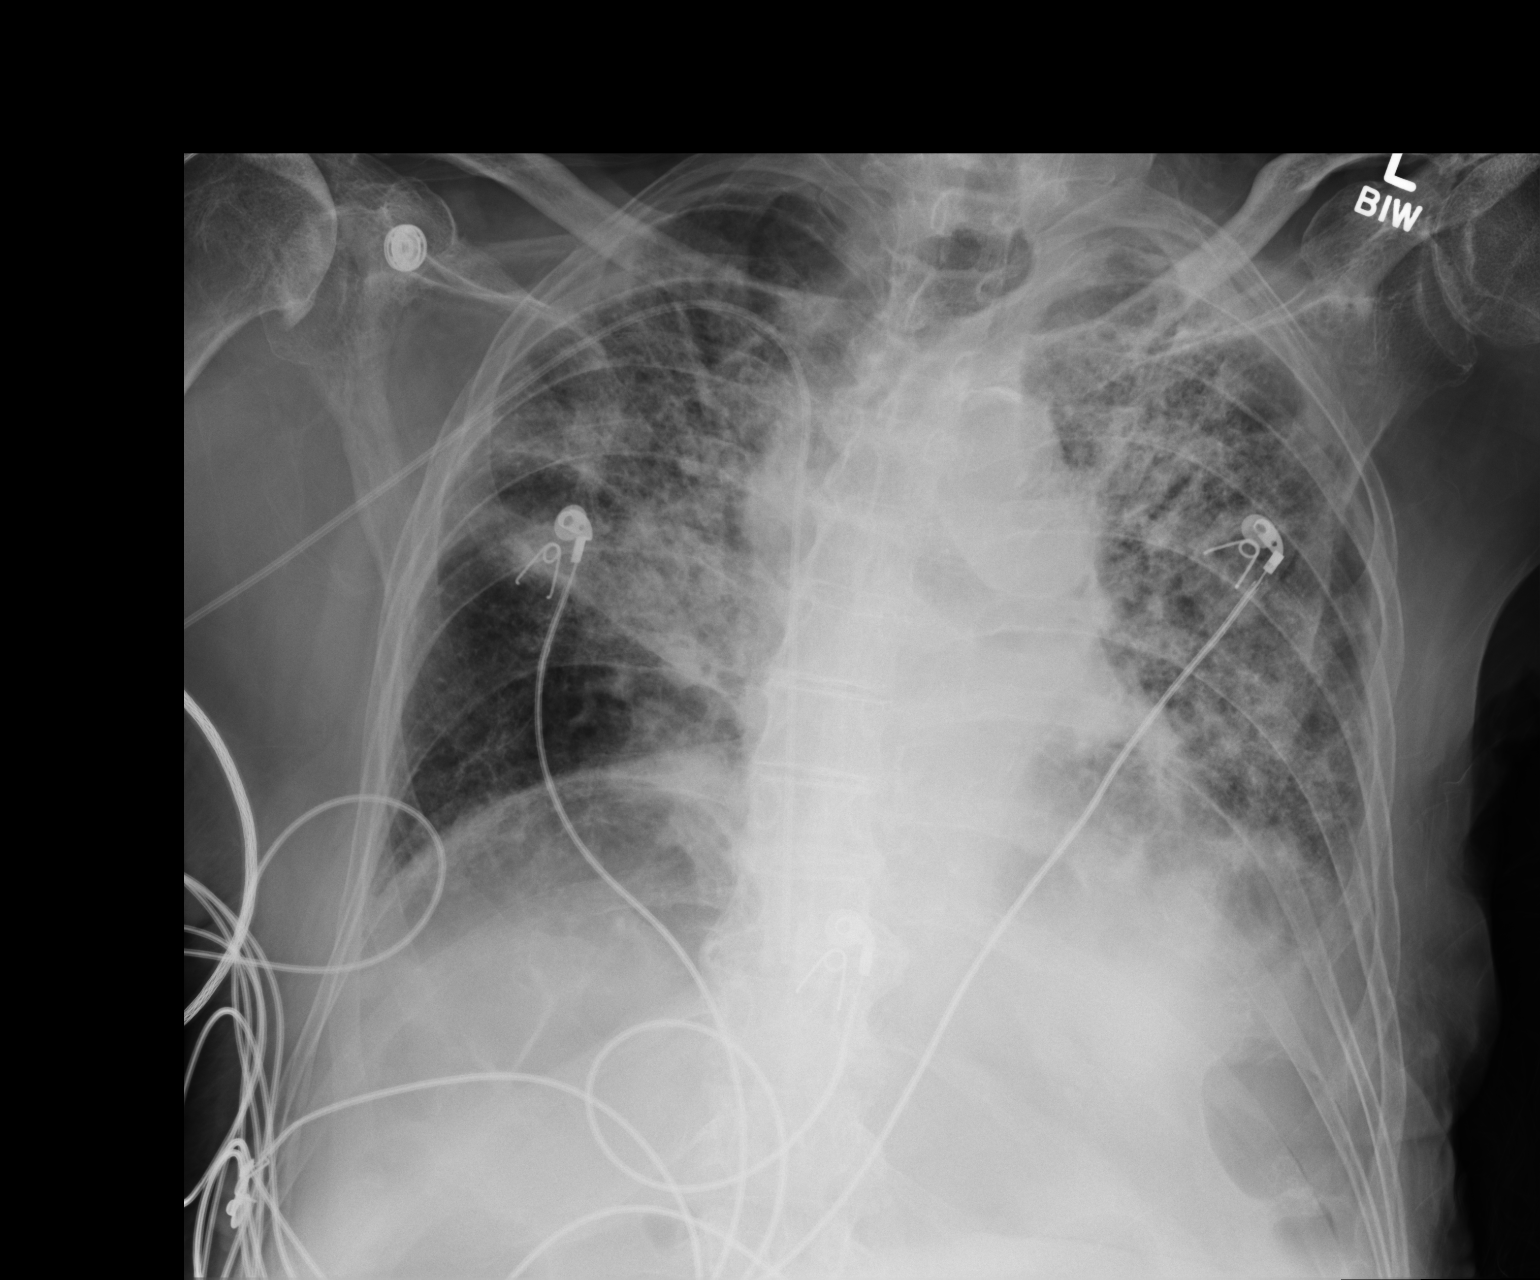

[1 of 1 positions shown; findings below may reference images not displayed]

FINDINGS: Stable cardiomegaly. Stable bilateral upper lobe opacities are noted
consistent with pneumonia. No pneumothorax is noted. Possible mild
left pleural effusion is noted. Degenerative change of both
glenohumeral joints is noted with subacromial space narrowing
suggesting rotator cuff injury. Interval placement of right-sided
PICC line with distal tip in the expected position of the right
atrium.
IMPRESSION: Stable bilateral lung opacities consistent with pneumonia. Interval
placement of right-sided PICC line with distal tip in the expected
position of the right atrium. Withdrawal by 3-4 cm is recommended.
Critical Value/emergent results were called by telephone at the time
of interpretation on 04/06/2014 at [DATE] to Doichin Lechev, the
patient's nurse, who verbally acknowledged these results.

## 2015-12-21 IMAGING — CR DG CHEST 1V PORT
1 series · 1 of 1 positions shown · non-contrast
Comparison: 04/01/2014

CLINICAL DATA: Subsequent evaluation for pneumonia

EXAM:
PORTABLE CHEST - 1 VIEW

[AP]
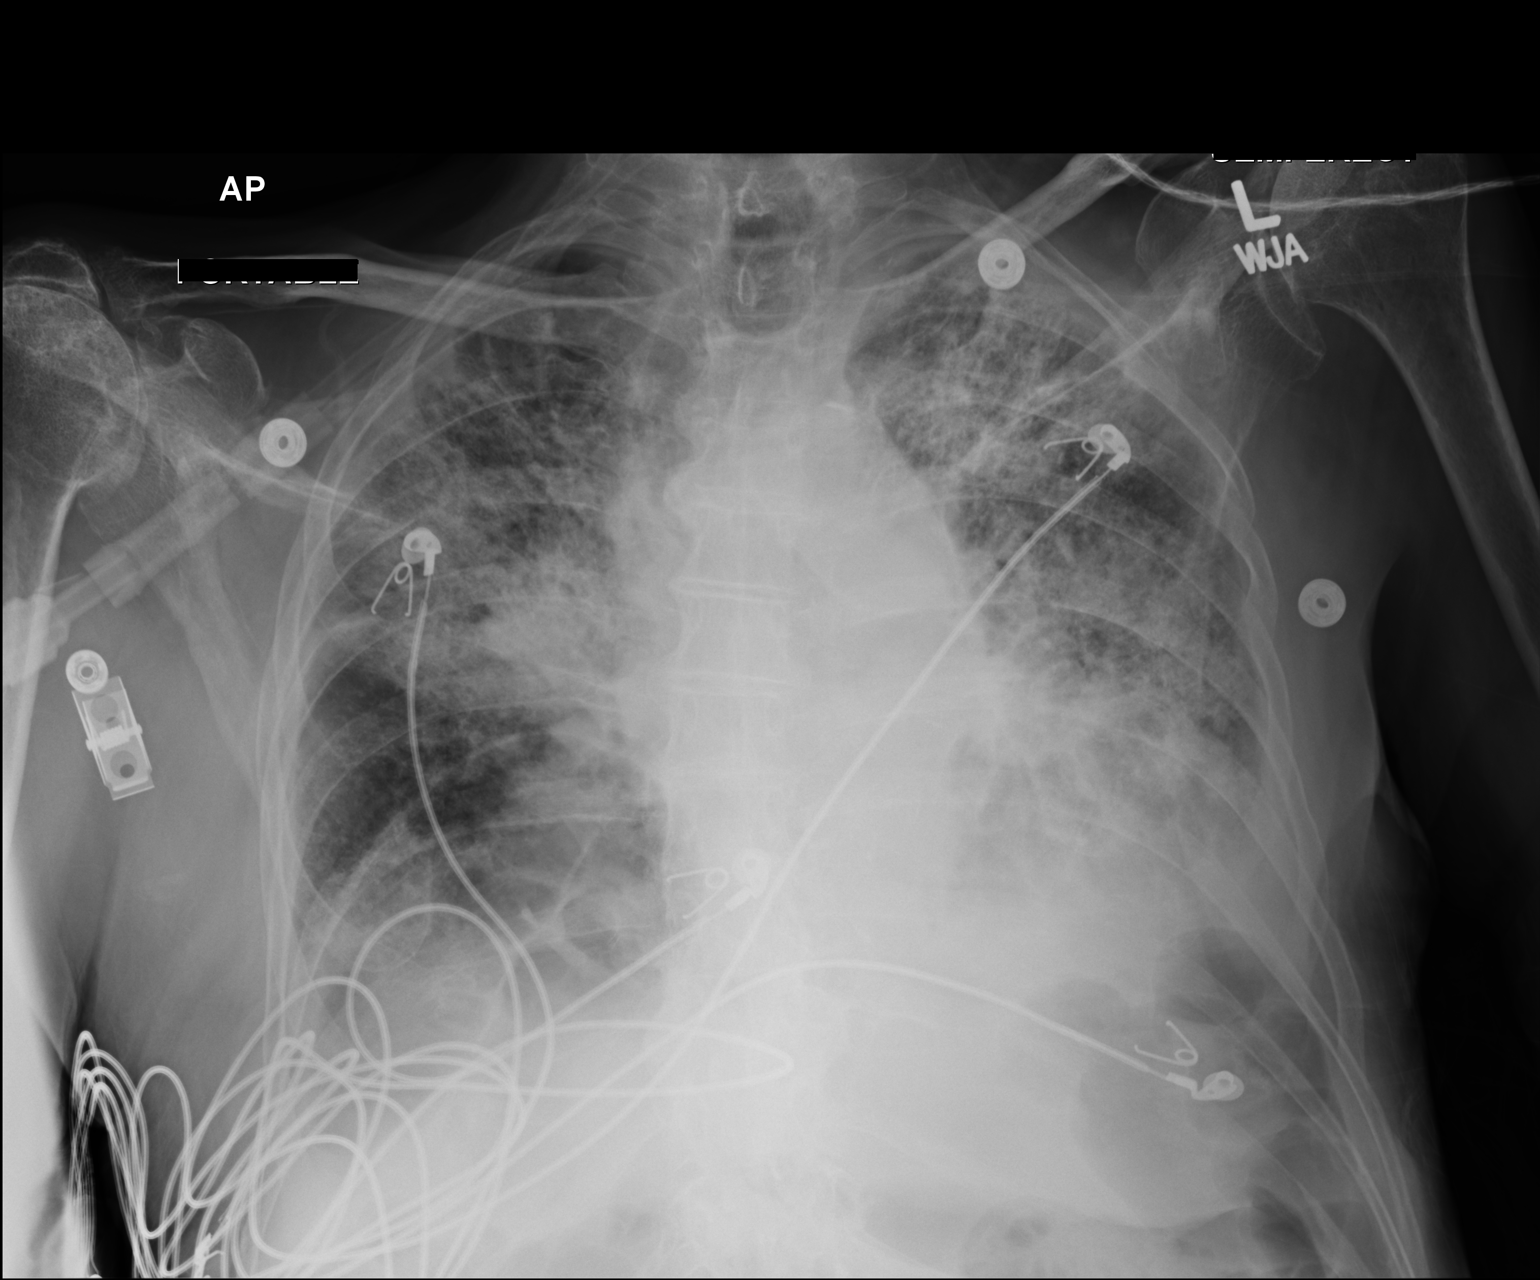

[1 of 1 positions shown; findings below may reference images not displayed]

FINDINGS: Extensive right upper lobe consolidation worse when compared to the
prior study. Extensive consolidation throughout the left lung also
worse when compared to the prior study. Evidence of small bilateral
pleural effusions similar to prior study.

Mild cardiac enlargement.
IMPRESSION: Bilateral extensive airspace opacification consistent with pneumonia
worse when compared to 04/01/2014.
